# Patient Record
Sex: Female | Born: 1980 | Race: Black or African American | Hispanic: No | Marital: Married | Smoking: Never smoker
Health system: Southern US, Community
[De-identification: ages and names within clinical notes are randomized; demographics above are authoritative.]

## PROBLEM LIST (undated history)

## (undated) DIAGNOSIS — I5181 Takotsubo syndrome: Secondary | ICD-10-CM

## (undated) DIAGNOSIS — G43909 Migraine, unspecified, not intractable, without status migrainosus: Secondary | ICD-10-CM

## (undated) DIAGNOSIS — F32A Depression, unspecified: Secondary | ICD-10-CM

## (undated) DIAGNOSIS — I10 Essential (primary) hypertension: Secondary | ICD-10-CM

## (undated) DIAGNOSIS — L509 Urticaria, unspecified: Secondary | ICD-10-CM

## (undated) DIAGNOSIS — J45909 Unspecified asthma, uncomplicated: Secondary | ICD-10-CM

## (undated) DIAGNOSIS — R519 Headache, unspecified: Secondary | ICD-10-CM

## (undated) DIAGNOSIS — F419 Anxiety disorder, unspecified: Secondary | ICD-10-CM

## (undated) DIAGNOSIS — L309 Dermatitis, unspecified: Secondary | ICD-10-CM

## (undated) DIAGNOSIS — R0600 Dyspnea, unspecified: Secondary | ICD-10-CM

## (undated) DIAGNOSIS — M1990 Unspecified osteoarthritis, unspecified site: Secondary | ICD-10-CM

## (undated) HISTORY — DX: Dermatitis, unspecified: L30.9

## (undated) HISTORY — PX: SINUS EXPLORATION: SHX5214

## (undated) HISTORY — PX: DIAGNOSTIC LAPAROSCOPY WITH REMOVAL OF ECTOPIC PREGNANCY: SHX6449

## (undated) HISTORY — DX: Urticaria, unspecified: L50.9

## (undated) HISTORY — PX: SINOSCOPY: SHX187

## (undated) HISTORY — PX: INDUCED ABORTION: SHX677

---

## 2001-06-07 ENCOUNTER — Emergency Department (HOSPITAL_COMMUNITY): Admission: EM | Admit: 2001-06-07 | Discharge: 2001-06-07 | Payer: Self-pay | Admitting: Emergency Medicine

## 2001-06-10 ENCOUNTER — Emergency Department (HOSPITAL_COMMUNITY): Admission: EM | Admit: 2001-06-10 | Discharge: 2001-06-11 | Payer: Self-pay | Admitting: Emergency Medicine

## 2002-05-21 ENCOUNTER — Encounter: Payer: Self-pay | Admitting: Otolaryngology

## 2002-05-21 ENCOUNTER — Encounter: Admission: RE | Admit: 2002-05-21 | Discharge: 2002-05-21 | Payer: Self-pay | Admitting: Otolaryngology

## 2002-05-26 ENCOUNTER — Encounter: Admission: RE | Admit: 2002-05-26 | Discharge: 2002-05-26 | Payer: Self-pay | Admitting: Otolaryngology

## 2002-05-26 ENCOUNTER — Encounter: Payer: Self-pay | Admitting: Otolaryngology

## 2002-06-25 ENCOUNTER — Ambulatory Visit (HOSPITAL_BASED_OUTPATIENT_CLINIC_OR_DEPARTMENT_OTHER): Admission: RE | Admit: 2002-06-25 | Discharge: 2002-06-25 | Payer: Self-pay | Admitting: Otolaryngology

## 2019-11-04 ENCOUNTER — Ambulatory Visit: Payer: Self-pay | Admitting: Allergy & Immunology

## 2020-08-05 ENCOUNTER — Inpatient Hospital Stay (HOSPITAL_BASED_OUTPATIENT_CLINIC_OR_DEPARTMENT_OTHER): Payer: BC Managed Care – PPO

## 2020-08-05 ENCOUNTER — Inpatient Hospital Stay (HOSPITAL_COMMUNITY)
Admission: AD | Admit: 2020-08-05 | Discharge: 2020-08-05 | Disposition: A | Discharge status: Home / Self Care | Payer: BC Managed Care – PPO | Attending: Obstetrics and Gynecology | Admitting: Obstetrics and Gynecology

## 2020-08-05 ENCOUNTER — Encounter (HOSPITAL_COMMUNITY): Payer: Self-pay | Admitting: Obstetrics and Gynecology

## 2020-08-05 ENCOUNTER — Other Ambulatory Visit: Payer: Self-pay

## 2020-08-05 ENCOUNTER — Other Ambulatory Visit: Payer: Self-pay | Admitting: Obstetrics and Gynecology

## 2020-08-05 DIAGNOSIS — O468X1 Other antepartum hemorrhage, first trimester: Secondary | ICD-10-CM

## 2020-08-05 DIAGNOSIS — Z3A08 8 weeks gestation of pregnancy: Secondary | ICD-10-CM | POA: Diagnosis not present

## 2020-08-05 DIAGNOSIS — Z8759 Personal history of other complications of pregnancy, childbirth and the puerperium: Secondary | ICD-10-CM | POA: Diagnosis not present

## 2020-08-05 DIAGNOSIS — O209 Hemorrhage in early pregnancy, unspecified: Secondary | ICD-10-CM | POA: Diagnosis not present

## 2020-08-05 DIAGNOSIS — O09521 Supervision of elderly multigravida, first trimester: Secondary | ICD-10-CM | POA: Diagnosis not present

## 2020-08-05 DIAGNOSIS — O4690 Antepartum hemorrhage, unspecified, unspecified trimester: Secondary | ICD-10-CM

## 2020-08-05 DIAGNOSIS — O10911 Unspecified pre-existing hypertension complicating pregnancy, first trimester: Secondary | ICD-10-CM | POA: Insufficient documentation

## 2020-08-05 DIAGNOSIS — O4691 Antepartum hemorrhage, unspecified, first trimester: Secondary | ICD-10-CM | POA: Diagnosis not present

## 2020-08-05 DIAGNOSIS — O09291 Supervision of pregnancy with other poor reproductive or obstetric history, first trimester: Secondary | ICD-10-CM | POA: Insufficient documentation

## 2020-08-05 DIAGNOSIS — O0911 Supervision of pregnancy with history of ectopic or molar pregnancy, first trimester: Secondary | ICD-10-CM

## 2020-08-05 DIAGNOSIS — O418X10 Other specified disorders of amniotic fluid and membranes, first trimester, not applicable or unspecified: Secondary | ICD-10-CM

## 2020-08-05 DIAGNOSIS — O208 Other hemorrhage in early pregnancy: Secondary | ICD-10-CM | POA: Diagnosis present

## 2020-08-05 DIAGNOSIS — Z3A01 Less than 8 weeks gestation of pregnancy: Secondary | ICD-10-CM | POA: Diagnosis not present

## 2020-08-05 HISTORY — DX: Headache, unspecified: R51.9

## 2020-08-05 HISTORY — DX: Essential (primary) hypertension: I10

## 2020-08-05 HISTORY — DX: Unspecified asthma, uncomplicated: J45.909

## 2020-08-05 LAB — URINALYSIS, ROUTINE W REFLEX MICROSCOPIC
Bilirubin Urine: NEGATIVE
Glucose, UA: NEGATIVE mg/dL
Ketones, ur: 5 mg/dL — AB
Nitrite: NEGATIVE
Protein, ur: NEGATIVE mg/dL
Specific Gravity, Urine: 1.014 (ref 1.005–1.030)
pH: 7 (ref 5.0–8.0)

## 2020-08-05 NOTE — MAU Provider Note (Signed)
History     CSN: [id number]  Arrival date and time: 08/05/20 1314   First Provider Initiated Contact with Patient 08/05/20 1504      Chief Complaint  Patient presents with  . Vaginal Bleeding  . Possible Pregnancy   HPI  BrookeBrooke Walters is a 39 y.o. female [phone] @ [redacted]w[redacted]d by very certain LMP, here with vaginal bleeding. Sent here by Dr. Cherly Hensen.  She reports the bleeding started back on 8/12 and she was not able to present to the hospital at that time d/t a sick child.  The bleeding slowed down and she had no pain and she decided not to get checked out. She does have history of ectopic.  On 8/25: she spoke to Dr. Cherly Hensen over the phone. She had been trying to establish care with Dr. Cherly Hensen and was able to talk to her yesterday. Dr. Cherly Hensen recommended she has blood work done. She presented to Dr. Cherly Hensen office today for lab work which showed she was pregnant and reports her Brooke Walters was 4,000. Dr. Cherly Hensen called her and told her she was pregnant and informed her she needed to go to MAU for [place] d/t bleeding and her history of ectopic. She reports a certain LMP of 6/29. She had a + home pregnancy test on 7/29.  She describes the bleeding as very light at this time.   She was told that she was out of network by Dr. Cherly Hensen office and they were not able to take her insurance at this time. The patient is agreeable to follow up with our office.   Patient reports A positive blood type.   OB History    Gravida  9   Para  2   Term  2   Preterm      AB  6   Living        SAB      TAB      Ectopic  1   Multiple      Live Births              Past Medical History:  Diagnosis Date  . Asthma   . Headache   . Hypertension       No family history on file.  Social History   Tobacco Use  . Smoking status: Not on file  Substance Use Topics  . Alcohol use: Not on file  . Drug use: Not on file    Allergies: No Known Allergies  No medications prior to  admission.   Results for orders placed or performed during the hospital encounter of 08/05/20 (from the past 48 hour(s))  Urinalysis, Routine w reflex microscopic Urine, Clean Catch     Status: Abnormal   Collection Time: 08/05/20  2:55 PM  Result Value Ref Range   Color, Urine YELLOW YELLOW   APPearance CLOUDY (A) CLEAR   Specific Gravity, Urine 1.014 1.005 - 1.030   pH 7.0 5.0 - 8.0   Glucose, UA NEGATIVE NEGATIVE mg/dL   Hgb urine dipstick SMALL (A) NEGATIVE   Bilirubin Urine NEGATIVE NEGATIVE   Ketones, ur 5 (A) NEGATIVE mg/dL   Protein, ur NEGATIVE NEGATIVE mg/dL   Nitrite NEGATIVE NEGATIVE   Leukocytes,Ua MODERATE (A) NEGATIVE   RBC / HPF 0-5 0 - 5 RBC/hpf   WBC, UA 6-10 0 - 5 WBC/hpf   Bacteria, UA FEW (A) NONE SEEN   Squamous Epithelial / LPF 11-20 0 - 5   Mucus PRESENT  Comment: Performed at [facility], [address] [place], [place] [address]    US OB LESS THAN 14 WEEKS WITH OB TRANSVAGINAL  Result Date: 08/05/2020 CLINICAL DATA:  Bleeding in first trimester of pregnancy, history of ectopic pregnancy; LMP 05/25/2020; no quantitative beta HCG for correlation EXAM: OBSTETRIC <14 WK [place] AND TRANSVAGINAL OB US TECHNIQUE: Both transabdominal and transvaginal ultrasound examinations were performed for complete evaluation of the gestation as well as the maternal uterus, adnexal regions, and pelvic cul-de-sac. Transvaginal technique was performed to assess early pregnancy. COMPARISON:  None FINDINGS: Intrauterine gestational sac: Present, single Yolk sac:  Present Embryo:  Present Cardiac Activity: Present Heart Rate: 104 bpm CRL:  4.2 mm   6 w   1 d                  [place] EDC: 03/30/2021 Subchorionic hemorrhage:  Small subchronic hemorrhage Maternal uterus/adnexae: Maternal uterus anteverted, otherwise unremarkable. RIGHT ovary measures 2.4 x 4.1 x 3.0 cm and contains a small corpus luteum. LEFT ovary normal size and morphology, 3.0 x 2.6 x 2.7 cm. No free pelvic fluid or  adnexal masses. IMPRESSION: Single live intrauterine gestation at 6 weeks 1 day EGA by crown-rump length. Small subchronic hemorrhage Electronically Signed   By: Ulyses Southward M.D.   On: 08/05/2020 16:06    Review of Systems  Gastrointestinal: Negative for abdominal pain.  Genitourinary: Positive for vaginal bleeding.   Physical Exam   Blood pressure 132/78, pulse 92, temperature 98.7 F (37.1 C), resp. rate 16, height 5\' 7"  (1.702 m), weight 75.3 kg, last menstrual period 05/25/2020, SpO2 100 %.  Physical Exam Constitutional:      General: She is not in acute distress.    Appearance: Normal appearance. She is not ill-appearing or toxic-appearing.  Eyes:     Pupils: Pupils are equal, round, and reactive to light.  Musculoskeletal:        General: Normal range of motion.  Skin:    General: Skin is warm.  Neurological:     Mental Status: She is alert and oriented to person, place, and time.  Psychiatric:        Mood and Affect: Mood normal.    MAU Course  Procedures  None  MDM  Dr. 05/27/2020 requested MAU provider to see patient.  Hcg: 4068 today Cherly Hensen ordered and complete Patient came to the nurses station fully dressed after [place] asking how much longer the wait would be; I did not evaluate her bleeding d/t patient dressed and ready to leave Discussed [place] in detail with patient   Assessment and Plan   A:  1. Subchorionic hemorrhage of placenta in first trimester, single or unspecified fetus   2. Vaginal bleeding during pregnancy   3. [redacted] weeks gestation of pregnancy     P:  Discharge home in stable condition SIUP Repeat US in 10-14 days for viability. LMP and CRL not consistent.  Return to MAU if symptoms worsen Bleeding reported as very light  Brooke Walters, 09-05-1978, NP 08/05/2020 7:34 PM

## 2020-08-05 NOTE — MAU Note (Signed)
.   Brooke Walters is a 39 y.o. at [redacted]w[redacted]d here in MAU reporting: she had an ectopic pregnancy in 2017 and she is here today to rule another ectopic out. Pt had her last cycle the middle of June. She was being evaluated in [facility] and state that she started having heavy vaginal bleeding with clots in July and thought she miscarriaged but went to follow up with cousins today and had a QUANT drawn and it showed a QUANT of 4068. PT states it could not be a new pregnancy. Here for follow up LMP: 05/25/20 Onset of complaint: ongoing Pain score: 0 Vitals:   08/05/20 1349  BP: 132/78  Pulse: 92  Resp: 16  Temp: 98.7 F (37.1 C)  SpO2: 100%     FHT: Lab orders placed from triage UA

## 2020-08-05 NOTE — Discharge Instructions (Signed)

## 2020-08-09 ENCOUNTER — Other Ambulatory Visit: Payer: Self-pay | Admitting: General Practice

## 2020-08-09 DIAGNOSIS — O3680X0 Pregnancy with inconclusive fetal viability, not applicable or unspecified: Secondary | ICD-10-CM

## 2020-08-18 ENCOUNTER — Other Ambulatory Visit: Payer: Self-pay

## 2020-08-18 ENCOUNTER — Telehealth: Payer: Self-pay | Admitting: Obstetrics and Gynecology

## 2020-08-18 ENCOUNTER — Ambulatory Visit
Admission: RE | Admit: 2020-08-18 | Discharge: 2020-08-18 | Disposition: A | Discharge status: Home / Self Care | Payer: BC Managed Care – PPO | Source: Ambulatory Visit | Attending: Obstetrics and Gynecology | Admitting: Obstetrics and Gynecology

## 2020-08-18 DIAGNOSIS — O3680X0 Pregnancy with inconclusive fetal viability, not applicable or unspecified: Secondary | ICD-10-CM | POA: Insufficient documentation

## 2020-08-18 MED ORDER — DOXYCYCLINE HYCLATE 50 MG PO CAPS: 200.0000 mg | ORAL_CAPSULE | Freq: Once | ORAL | 0 refills | Status: AC

## 2020-08-18 MED ORDER — LORAZEPAM 2 MG PO TABS: 2.0000 mg | ORAL_TABLET | Freq: Three times a day (TID) | ORAL | 0 refills | Status: DC

## 2020-08-18 MED ORDER — OXYCODONE-ACETAMINOPHEN 10-325 MG PO TABS: 1.0000 | ORAL_TABLET | Freq: Once | ORAL | 0 refills | Status: AC

## 2020-08-18 NOTE — Telephone Encounter (Signed)
F/u US shows 6 week fetus now without a fetal heart rate. The patient was called at home this evening and the results were discussed with her.  We discussed options for evacuation of fetus including watchful waiting, Cytotec, D&E and MVA. Patient opted to have MVA in the office.   Rx: Percocet, Ativan, Doxycyline sent.  High priority message sent to [facility] to schedule. The patient was given the office phone number.    Duane Lope, NP 08/18/2020 6:39 PM

## 2020-08-19 ENCOUNTER — Ambulatory Visit: Payer: BC Managed Care – PPO

## 2020-08-20 ENCOUNTER — Telehealth: Payer: Self-pay

## 2020-08-20 ENCOUNTER — Other Ambulatory Visit (HOSPITAL_COMMUNITY): Admission: RE | Admit: 2020-08-20 | Payer: BC Managed Care – PPO | Source: Ambulatory Visit

## 2020-08-20 NOTE — Telephone Encounter (Signed)
Called and spoke to the patient, surgery date, time and pre-op instructions given. Patient expressed understanding. All questions answered. Advised her to give [place] a call back if she needed anything before her surgery.

## 2020-08-20 NOTE — Telephone Encounter (Signed)
-----   Message from Blenda Mounts sent at 08/19/2020  3:04 PM EDT ----- Regarding: D & C for patient   Good Afternoon Rhea Bleacher,  This patient needs to schedule for a D& C with OR.  Venia Carbon sent a message that the patient needs an MVA in the office.  We do not have any available appointments.  I also reached out to the [facility] office and they didn't have any available appointments as well.    Thanks Verdie Mosher

## 2020-08-20 NOTE — Telephone Encounter (Signed)
Patient called and left a message about waiting to add on a tubal the day of her D&E.   Called patient back and let her know that I would pass her message along to the MD performing her surgery and that I would also send a message to our person who does authorizations so if Dr. Alysia Penna does agree we would already have authorization. Patient expressed understanding  Message sent to appropriate staff members

## 2020-08-23 ENCOUNTER — Encounter (HOSPITAL_BASED_OUTPATIENT_CLINIC_OR_DEPARTMENT_OTHER)
Admission: RE | Admit: 2020-08-23 | Discharge: 2020-08-23 | Disposition: A | Discharge status: Home / Self Care | Payer: BC Managed Care – PPO | Source: Ambulatory Visit | Attending: Obstetrics and Gynecology | Admitting: Obstetrics and Gynecology

## 2020-08-23 ENCOUNTER — Encounter (HOSPITAL_BASED_OUTPATIENT_CLINIC_OR_DEPARTMENT_OTHER): Payer: Self-pay | Admitting: Obstetrics and Gynecology

## 2020-08-23 ENCOUNTER — Other Ambulatory Visit: Payer: Self-pay

## 2020-08-23 ENCOUNTER — Other Ambulatory Visit (HOSPITAL_COMMUNITY)
Admission: RE | Admit: 2020-08-23 | Discharge: 2020-08-23 | Disposition: A | Discharge status: Home / Self Care | Payer: BC Managed Care – PPO | Source: Ambulatory Visit | Attending: Obstetrics and Gynecology | Admitting: Obstetrics and Gynecology

## 2020-08-23 DIAGNOSIS — Z9102 Food additives allergy status: Secondary | ICD-10-CM | POA: Diagnosis not present

## 2020-08-23 DIAGNOSIS — Z302 Encounter for sterilization: Secondary | ICD-10-CM | POA: Diagnosis present

## 2020-08-23 DIAGNOSIS — I10 Essential (primary) hypertension: Secondary | ICD-10-CM | POA: Diagnosis not present

## 2020-08-23 DIAGNOSIS — M1990 Unspecified osteoarthritis, unspecified site: Secondary | ICD-10-CM | POA: Diagnosis not present

## 2020-08-23 DIAGNOSIS — O99411 Diseases of the circulatory system complicating pregnancy, first trimester: Secondary | ICD-10-CM | POA: Diagnosis not present

## 2020-08-23 DIAGNOSIS — Z01812 Encounter for preprocedural laboratory examination: Secondary | ICD-10-CM | POA: Insufficient documentation

## 2020-08-23 DIAGNOSIS — Z886 Allergy status to analgesic agent status: Secondary | ICD-10-CM | POA: Diagnosis not present

## 2020-08-23 DIAGNOSIS — J45909 Unspecified asthma, uncomplicated: Secondary | ICD-10-CM | POA: Diagnosis not present

## 2020-08-23 DIAGNOSIS — Z20822 Contact with and (suspected) exposure to covid-19: Secondary | ICD-10-CM | POA: Diagnosis not present

## 2020-08-23 DIAGNOSIS — O99511 Diseases of the respiratory system complicating pregnancy, first trimester: Secondary | ICD-10-CM | POA: Diagnosis not present

## 2020-08-23 DIAGNOSIS — O99891 Other specified diseases and conditions complicating pregnancy: Secondary | ICD-10-CM | POA: Diagnosis not present

## 2020-08-23 DIAGNOSIS — O021 Missed abortion: Secondary | ICD-10-CM | POA: Diagnosis not present

## 2020-08-23 DIAGNOSIS — Z888 Allergy status to other drugs, medicaments and biological substances status: Secondary | ICD-10-CM | POA: Diagnosis not present

## 2020-08-23 DIAGNOSIS — Z8759 Personal history of other complications of pregnancy, childbirth and the puerperium: Secondary | ICD-10-CM | POA: Diagnosis not present

## 2020-08-23 DIAGNOSIS — Z880 Allergy status to penicillin: Secondary | ICD-10-CM | POA: Diagnosis not present

## 2020-08-23 LAB — CBC
HCT: 36.5 % (ref 36.0–46.0)
Hemoglobin: 11.5 g/dL — ABNORMAL LOW (ref 12.0–15.0)
MCH: 32.2 pg (ref 26.0–34.0)
MCHC: 31.5 g/dL (ref 30.0–36.0)
MCV: 102.2 fL — ABNORMAL HIGH (ref 80.0–100.0)
Platelets: 199 10*3/uL (ref 150–400)
RBC: 3.57 MIL/uL — ABNORMAL LOW (ref 3.87–5.11)
RDW: 13.3 % (ref 11.5–15.5)
WBC: 4.3 10*3/uL (ref 4.0–10.5)
nRBC: 0 % (ref 0.0–0.2)

## 2020-08-23 LAB — COMPREHENSIVE METABOLIC PANEL
ALT: 19 U/L (ref 0–44)
AST: 39 U/L (ref 15–41)
Albumin: 4 g/dL (ref 3.5–5.0)
Alkaline Phosphatase: 42 U/L (ref 38–126)
Anion gap: 8 (ref 5–15)
BUN: 13 mg/dL (ref 6–20)
CO2: 25 mmol/L (ref 22–32)
Calcium: 9.1 mg/dL (ref 8.9–10.3)
Chloride: 106 mmol/L (ref 98–111)
Creatinine, Ser: 0.95 mg/dL (ref 0.44–1.00)
GFR calc Af Amer: >60 mL/min (ref >60)
GFR calc non Af Amer: >60 mL/min (ref >60)
Glucose, Bld: 90 mg/dL (ref 70–99)
Potassium: 4.2 mmol/L (ref 3.5–5.1)
Sodium: 139 mmol/L (ref 135–145)
Total Bilirubin: 0.7 mg/dL (ref 0.3–1.2)
Total Protein: 6.9 g/dL (ref 6.5–8.1)

## 2020-08-23 LAB — TYPE AND SCREEN
ABO/RH(D): A POS
Antibody Screen: NEGATIVE

## 2020-08-23 LAB — SARS CORONAVIRUS 2 (TAT 6-24 HRS): SARS Coronavirus 2: NEGATIVE

## 2020-08-23 NOTE — H&P (Addendum)
Brooke Walters is an 39 y.o. female [phone] with Dx of MAB by 6 week U/S. Desires definite therapy. H/O ectopic pregnancy in the past. She also desires tubal ligation   Menstrual History: Menarche age: 37 Patient's last menstrual period was 06/08/2020.    Past Medical History:  Diagnosis Date  . Anxiety   . Asthma   . Broken heart syndrome   . Headache   . Hypertension   . Migraine   . Osteoarthritis     Past Surgical History:  Procedure Laterality Date  . DIAGNOSTIC LAPAROSCOPY WITH REMOVAL OF ECTOPIC PREGNANCY    . INDUCED ABORTION    . SINUS EXPLORATION     x2    History reviewed. No pertinent family history.  Social History:  reports that she has never smoked. She has never used smokeless tobacco. She reports previous alcohol use. She reports that she does not use drugs.  Allergies:  Allergies  Allergen Reactions  . Ibuprofen Shortness Of Breath  . Penicillins Hives  . Yellow Dyes (Non-Tartrazine) Hives    Pt received dye durning surgery. Not sure what type of dye  . Zyrtec [Cetirizine] Hives    No medications prior to admission.    Review of Systems  Constitutional: Negative.   Respiratory: Negative.   Cardiovascular: Negative.   Gastrointestinal: Negative.     Height 5' 7.5" (1.715 m), weight 74.4 kg, last menstrual period 06/08/2020. Physical Exam Constitutional:      Appearance: Normal appearance.  Cardiovascular:     Rate and Rhythm: Normal rate and regular rhythm.     Heart sounds: Normal heart sounds.  Pulmonary:     Effort: Pulmonary effort is normal.     Breath sounds: Normal breath sounds.  Abdominal:     General: Bowel sounds are normal.     Palpations: Abdomen is soft.  Genitourinary:    Comments: Deferred to OR Neurological:     Mental Status: She is alert.     No results found for this or any previous visit (from the past 24 hour(s)).  No results found.  Assessment/Plan: Missed AB Unwanted fertility  Pt for  suction D & C. Methods of tubal ligation reviewed with pt. Following discussion pt desires salpingectomy.  R/B/Post op care of procedure reviewed with pt. Pt has verbalized understanding and desires to proceed.   Hermina Staggers 08/23/2020, 10:02 AM

## 2020-08-24 ENCOUNTER — Other Ambulatory Visit: Payer: Self-pay

## 2020-08-24 ENCOUNTER — Encounter (HOSPITAL_BASED_OUTPATIENT_CLINIC_OR_DEPARTMENT_OTHER): Payer: Self-pay | Admitting: Obstetrics and Gynecology

## 2020-08-24 ENCOUNTER — Ambulatory Visit (HOSPITAL_BASED_OUTPATIENT_CLINIC_OR_DEPARTMENT_OTHER)
Admission: RE | Admit: 2020-08-24 | Discharge: 2020-08-24 | Disposition: A | Discharge status: Home / Self Care | Payer: BC Managed Care – PPO | Attending: Obstetrics and Gynecology | Admitting: Obstetrics and Gynecology

## 2020-08-24 ENCOUNTER — Encounter (HOSPITAL_BASED_OUTPATIENT_CLINIC_OR_DEPARTMENT_OTHER)
Admission: RE | Disposition: A | Discharge status: Home / Self Care | Payer: Self-pay | Source: Home / Self Care | Attending: Obstetrics and Gynecology

## 2020-08-24 ENCOUNTER — Ambulatory Visit (HOSPITAL_BASED_OUTPATIENT_CLINIC_OR_DEPARTMENT_OTHER): Payer: BC Managed Care – PPO | Admitting: Certified Registered"

## 2020-08-24 DIAGNOSIS — M1990 Unspecified osteoarthritis, unspecified site: Secondary | ICD-10-CM | POA: Insufficient documentation

## 2020-08-24 DIAGNOSIS — Z880 Allergy status to penicillin: Secondary | ICD-10-CM | POA: Insufficient documentation

## 2020-08-24 DIAGNOSIS — Z886 Allergy status to analgesic agent status: Secondary | ICD-10-CM | POA: Insufficient documentation

## 2020-08-24 DIAGNOSIS — Z302 Encounter for sterilization: Secondary | ICD-10-CM | POA: Diagnosis not present

## 2020-08-24 DIAGNOSIS — Z9102 Food additives allergy status: Secondary | ICD-10-CM | POA: Insufficient documentation

## 2020-08-24 DIAGNOSIS — O021 Missed abortion: Secondary | ICD-10-CM | POA: Diagnosis not present

## 2020-08-24 DIAGNOSIS — O99891 Other specified diseases and conditions complicating pregnancy: Secondary | ICD-10-CM | POA: Insufficient documentation

## 2020-08-24 DIAGNOSIS — O99411 Diseases of the circulatory system complicating pregnancy, first trimester: Secondary | ICD-10-CM | POA: Insufficient documentation

## 2020-08-24 DIAGNOSIS — J45909 Unspecified asthma, uncomplicated: Secondary | ICD-10-CM | POA: Insufficient documentation

## 2020-08-24 DIAGNOSIS — I10 Essential (primary) hypertension: Secondary | ICD-10-CM | POA: Insufficient documentation

## 2020-08-24 DIAGNOSIS — Z8759 Personal history of other complications of pregnancy, childbirth and the puerperium: Secondary | ICD-10-CM | POA: Insufficient documentation

## 2020-08-24 DIAGNOSIS — O99511 Diseases of the respiratory system complicating pregnancy, first trimester: Secondary | ICD-10-CM | POA: Insufficient documentation

## 2020-08-24 DIAGNOSIS — Z888 Allergy status to other drugs, medicaments and biological substances status: Secondary | ICD-10-CM | POA: Insufficient documentation

## 2020-08-24 DIAGNOSIS — Z3009 Encounter for other general counseling and advice on contraception: Secondary | ICD-10-CM

## 2020-08-24 HISTORY — DX: Takotsubo syndrome: I51.81

## 2020-08-24 HISTORY — PX: LAPAROSCOPIC TUBAL LIGATION: SHX1937

## 2020-08-24 HISTORY — DX: Migraine, unspecified, not intractable, without status migrainosus: G43.909

## 2020-08-24 HISTORY — DX: Anxiety disorder, unspecified: F41.9

## 2020-08-24 HISTORY — DX: Unspecified osteoarthritis, unspecified site: M19.90

## 2020-08-24 HISTORY — PX: DILATION AND EVACUATION: SHX1459

## 2020-08-24 LAB — ABO/RH: ABO/RH(D): A POS

## 2020-08-24 SURGERY — DILATION AND EVACUATION, UTERUS
Anesthesia: General | Site: Vagina

## 2020-08-24 MED ORDER — FENTANYL CITRATE (PF) 100 MCG/2ML IJ SOLN
INTRAMUSCULAR | Status: AC
  Filled 2020-08-24: qty 2

## 2020-08-24 MED ORDER — POVIDONE-IODINE 10 % EX SWAB: 2.0000 "application " | Freq: Once | CUTANEOUS | Status: DC

## 2020-08-24 MED ORDER — PROMETHAZINE HCL 25 MG/ML IJ SOLN: 6.2500 mg | INTRAMUSCULAR | Status: DC | PRN

## 2020-08-24 MED ORDER — SUGAMMADEX SODIUM 200 MG/2ML IV SOLN
INTRAVENOUS | Status: DC | PRN
  Administered 2020-08-24: 200 mg via INTRAVENOUS

## 2020-08-24 MED ORDER — SILVER NITRATE-POT NITRATE 75-25 % EX MISC
CUTANEOUS | Status: AC
  Filled 2020-08-24: qty 10

## 2020-08-24 MED ORDER — AMISULPRIDE (ANTIEMETIC) 5 MG/2ML IV SOLN: 10.0000 mg | Freq: Once | INTRAVENOUS | Status: DC | PRN

## 2020-08-24 MED ORDER — LACTATED RINGERS IV SOLN: INTRAVENOUS | Status: DC

## 2020-08-24 MED ORDER — OXYCODONE HCL 5 MG PO TABS
5.0000 mg | ORAL_TABLET | Freq: Once | ORAL | Status: AC | PRN
  Administered 2020-08-24: 5 mg via ORAL

## 2020-08-24 MED ORDER — ROCURONIUM BROMIDE 100 MG/10ML IV SOLN
INTRAVENOUS | Status: DC | PRN
  Administered 2020-08-24: 70 mg via INTRAVENOUS

## 2020-08-24 MED ORDER — ACETAMINOPHEN 500 MG PO TABS
ORAL_TABLET | ORAL | Status: AC
  Filled 2020-08-24: qty 2

## 2020-08-24 MED ORDER — ACETAMINOPHEN 500 MG PO TABS
1000.0000 mg | ORAL_TABLET | ORAL | Status: AC
  Administered 2020-08-24: 1000 mg via ORAL

## 2020-08-24 MED ORDER — HYDROMORPHONE HCL 1 MG/ML IJ SOLN
0.2500 mg | INTRAMUSCULAR | Status: DC | PRN
  Administered 2020-08-24 (×3): 0.5 mg via INTRAVENOUS

## 2020-08-24 MED ORDER — FENTANYL CITRATE (PF) 100 MCG/2ML IJ SOLN
INTRAMUSCULAR | Status: DC | PRN
Start: 2020-08-24 — End: 2020-08-24
  Administered 2020-08-24 (×3): 50 ug via INTRAVENOUS

## 2020-08-24 MED ORDER — OXYCODONE HCL 5 MG/5ML PO SOLN: 5.0000 mg | Freq: Once | ORAL | Status: AC | PRN

## 2020-08-24 MED ORDER — MEPERIDINE HCL 25 MG/ML IJ SOLN: 6.2500 mg | INTRAMUSCULAR | Status: DC | PRN

## 2020-08-24 MED ORDER — ESMOLOL HCL 100 MG/10ML IV SOLN
INTRAVENOUS | Status: AC
  Filled 2020-08-24: qty 10

## 2020-08-24 MED ORDER — HYDROMORPHONE HCL 1 MG/ML IJ SOLN
INTRAMUSCULAR | Status: AC
  Filled 2020-08-24: qty 0.5

## 2020-08-24 MED ORDER — ONDANSETRON HCL 4 MG/2ML IJ SOLN
INTRAMUSCULAR | Status: DC | PRN
  Administered 2020-08-24 (×2): 4 mg via INTRAVENOUS

## 2020-08-24 MED ORDER — LIDOCAINE HCL (PF) 1 % IJ SOLN
INTRAMUSCULAR | Status: AC
  Filled 2020-08-24: qty 30

## 2020-08-24 MED ORDER — OXYCODONE HCL 5 MG PO TABS: 5.0000 mg | ORAL_TABLET | Freq: Four times a day (QID) | ORAL | 0 refills | Status: DC | PRN

## 2020-08-24 MED ORDER — LIDOCAINE 2% (20 MG/ML) 5 ML SYRINGE
INTRAMUSCULAR | Status: AC
  Filled 2020-08-24: qty 5

## 2020-08-24 MED ORDER — ONDANSETRON HCL 4 MG/2ML IJ SOLN
INTRAMUSCULAR | Status: AC
  Filled 2020-08-24: qty 4

## 2020-08-24 MED ORDER — DEXAMETHASONE SODIUM PHOSPHATE 10 MG/ML IJ SOLN
INTRAMUSCULAR | Status: AC
  Filled 2020-08-24: qty 1

## 2020-08-24 MED ORDER — DEXAMETHASONE SODIUM PHOSPHATE 10 MG/ML IJ SOLN
INTRAMUSCULAR | Status: DC | PRN
  Administered 2020-08-24: 10 mg via INTRAVENOUS

## 2020-08-24 MED ORDER — BUPIVACAINE HCL 0.25 % IJ SOLN
INTRAMUSCULAR | Status: DC | PRN
  Administered 2020-08-24: 30 mL

## 2020-08-24 MED ORDER — KETOROLAC TROMETHAMINE 30 MG/ML IJ SOLN
INTRAMUSCULAR | Status: AC
  Filled 2020-08-24: qty 1

## 2020-08-24 MED ORDER — MIDAZOLAM HCL 2 MG/2ML IJ SOLN
INTRAMUSCULAR | Status: AC
  Filled 2020-08-24: qty 2

## 2020-08-24 MED ORDER — PROPOFOL 10 MG/ML IV BOLUS
INTRAVENOUS | Status: DC | PRN
  Administered 2020-08-24: 150 mg via INTRAVENOUS

## 2020-08-24 MED ORDER — MIDAZOLAM HCL 5 MG/5ML IJ SOLN
INTRAMUSCULAR | Status: DC | PRN
  Administered 2020-08-24: 2 mg via INTRAVENOUS

## 2020-08-24 MED ORDER — MISOPROSTOL 200 MCG PO TABS
ORAL_TABLET | ORAL | Status: AC
  Filled 2020-08-24: qty 1

## 2020-08-24 MED ORDER — SOD CITRATE-CITRIC ACID 500-334 MG/5ML PO SOLN
30.0000 mL | ORAL | Status: AC
  Administered 2020-08-24: 30 mL via ORAL
  Filled 2020-08-24 (×2): qty 30

## 2020-08-24 MED ORDER — SUGAMMADEX SODIUM 500 MG/5ML IV SOLN
INTRAVENOUS | Status: AC
  Filled 2020-08-24: qty 5

## 2020-08-24 MED ORDER — OXYCODONE HCL 5 MG PO TABS
ORAL_TABLET | ORAL | Status: AC
  Filled 2020-08-24: qty 1

## 2020-08-24 MED ORDER — METHYLERGONOVINE MALEATE 0.2 MG PO TABS
ORAL_TABLET | ORAL | Status: AC
  Filled 2020-08-24: qty 1

## 2020-08-24 MED ORDER — PROPOFOL 500 MG/50ML IV EMUL
INTRAVENOUS | Status: AC
  Filled 2020-08-24: qty 50

## 2020-08-24 MED ORDER — LACTATED RINGERS IV SOLN
INTRAVENOUS | Status: DC
  Administered 2020-08-24: 100 mL/h via INTRAVENOUS

## 2020-08-24 MED ORDER — BUPIVACAINE HCL (PF) 0.25 % IJ SOLN
INTRAMUSCULAR | Status: AC
  Filled 2020-08-24: qty 30

## 2020-08-24 MED ORDER — ONDANSETRON HCL 4 MG/2ML IJ SOLN
INTRAMUSCULAR | Status: AC
  Filled 2020-08-24: qty 2

## 2020-08-24 MED ORDER — LIDOCAINE 2% (20 MG/ML) 5 ML SYRINGE
INTRAMUSCULAR | Status: DC | PRN
  Administered 2020-08-24: 80 mg via INTRAVENOUS

## 2020-08-24 SURGICAL SUPPLY — 39 items
CATH ROBINSON RED A/P 16FR (CATHETERS) ×3 IMPLANT
DECANTER SPIKE VIAL GLASS SM (MISCELLANEOUS) ×3 IMPLANT
DRSG OPSITE POSTOP 3X4 (GAUZE/BANDAGES/DRESSINGS) ×1 IMPLANT
DURAPREP 26ML APPLICATOR (WOUND CARE) ×3 IMPLANT
GAUZE 4X4 16PLY RFD (DISPOSABLE) ×3 IMPLANT
GLOVE BIO SURGEON STRL SZ7.5 (GLOVE) ×6 IMPLANT
GLOVE BIOGEL PI IND STRL 7.0 (GLOVE) ×6 IMPLANT
GLOVE BIOGEL PI IND STRL 8 (GLOVE) ×2 IMPLANT
GLOVE BIOGEL PI INDICATOR 7.0 (GLOVE) ×3
GLOVE BIOGEL PI INDICATOR 8 (GLOVE) ×1
GOWN STRL REUS W/ TWL LRG LVL3 (GOWN DISPOSABLE) ×2 IMPLANT
GOWN STRL REUS W/ TWL XL LVL3 (GOWN DISPOSABLE) ×2 IMPLANT
GOWN STRL REUS W/TWL LRG LVL3 (GOWN DISPOSABLE) ×6 IMPLANT
GOWN STRL REUS W/TWL XL LVL3 (GOWN DISPOSABLE) ×6 IMPLANT
HIBICLENS CHG 4% 4OZ BTL (MISCELLANEOUS) ×3 IMPLANT
KIT BERKELEY 1ST TRI 3/8 NO TR (MISCELLANEOUS) ×3 IMPLANT
KIT BERKELEY 1ST TRIMESTER 3/8 (MISCELLANEOUS) ×3 IMPLANT
NS IRRIG 1000ML POUR BTL (IV SOLUTION) ×6 IMPLANT
PACK LAPAROSCOPY BASIN (CUSTOM PROCEDURE TRAY) ×3 IMPLANT
PACK TRENDGUARD 450 HYBRID PRO (MISCELLANEOUS) IMPLANT
PACK VAGINAL MINOR WOMEN LF (CUSTOM PROCEDURE TRAY) ×3 IMPLANT
PAD OB MATERNITY 4.3X12.25 (PERSONAL CARE ITEMS) ×6 IMPLANT
PAD PREP 24X48 CUFFED NSTRL (MISCELLANEOUS) ×6 IMPLANT
SET BERKELEY SUCTION TUBING (SUCTIONS) ×3 IMPLANT
SET TUBE SMOKE EVAC HIGH FLOW (TUBING) ×3 IMPLANT
SHEARS HARMONIC ACE PLUS 36CM (ENDOMECHANICALS) ×1 IMPLANT
SLEEVE SCD COMPRESS KNEE MED (MISCELLANEOUS) ×3 IMPLANT
SUT MNCRL AB 4-0 PS2 18 (SUTURE) ×3 IMPLANT
SUT VIC AB 2-0 CT1 27 (SUTURE) ×3
SUT VIC AB 2-0 CT1 TAPERPNT 27 (SUTURE) IMPLANT
SUT VICRYL 0 UR6 27IN ABS (SUTURE) ×6 IMPLANT
TOWEL GREEN STERILE FF (TOWEL DISPOSABLE) ×12 IMPLANT
TRENDGUARD 450 HYBRID PRO PACK (MISCELLANEOUS) ×3
TROCAR BALLN 12MMX100 BLUNT (TROCAR) ×3 IMPLANT
VACURETTE 10 RIGID CVD (CANNULA) IMPLANT
VACURETTE 7MM CVD STRL WRAP (CANNULA) ×1 IMPLANT
VACURETTE 8 RIGID CVD (CANNULA) IMPLANT
VACURETTE 9 RIGID CVD (CANNULA) IMPLANT
WARMER LAPAROSCOPE (MISCELLANEOUS) ×3 IMPLANT

## 2020-08-24 NOTE — Transfer of Care (Signed)
Immediate Anesthesia Transfer of Care Note  Patient: Brooke Walters  Procedure(s) Performed: DILATATION AND EVACUATION (N/A Vagina ) LAPAROSCOPIC LEFT SALPINGECTOMY (Left Abdomen)  Patient Location: PACU  Anesthesia Type:General  Level of Consciousness: drowsy and patient cooperative  Airway & Oxygen Therapy: Patient Spontanous Breathing and Patient connected to face mask oxygen  Post-op Assessment: Report given to RN and Post -op Vital signs reviewed and stable  Post vital signs: Reviewed and stable  Last Vitals:  Vitals Value Taken Time  BP 131/87 08/24/20 0907  Temp    Pulse 102 08/24/20 0911  Resp 18 08/24/20 0911  SpO2 100 % 08/24/20 0911  Vitals shown include unvalidated device data.  Last Pain:  Vitals:   08/24/20 0647  TempSrc: Oral  PainSc: 0-No pain      Patients Stated Pain Goal: 5 (08/24/20 2440)  Complications: No complications documented.

## 2020-08-24 NOTE — Discharge Instructions (Signed)
Laparoscopic Tubal Ligation, Care After This sheet gives you information about how to care for yourself after your procedure. Your health care provider may also give you more specific instructions. If you have problems or questions, contact your health care provider. What can I expect after the procedure? After the procedure, it is common to have:  A sore throat.  Discomfort in your shoulder.  Mild discomfort or cramping in your abdomen.  Gas pains.  Pain or soreness in the area where the surgical incision was made.  A bloated feeling.  Tiredness.  Nausea.  Vomiting. Follow these instructions at home: Medicines  Take over-the-counter and prescription medicines only as told by your health care provider.  Do not take aspirin because it can cause bleeding.  Ask your health care provider if the medicine prescribed to you: ? Requires you to avoid driving or using heavy machinery. ? Can cause constipation. You may need to take actions to prevent or treat constipation, such as:  Drink enough fluid to keep your urine pale yellow.  Take over-the-counter or prescription medicines.  Eat foods that are high in fiber, such as beans, whole grains, and fresh fruits and vegetables.  Limit foods that are high in fat and processed sugars, such as fried or sweet foods. Incision care      Follow instructions from your health care provider about how to take care of your incision. Make sure you: ? Wash your hands with soap and water before and after you change your bandage (dressing). If soap and water are not available, use hand sanitizer. ? Change your dressing as told by your health care provider. ? Leave stitches (sutures), skin glue, or adhesive strips in place. These skin closures may need to stay in place for 2 weeks or longer. If adhesive strip edges start to loosen and curl up, you may trim the loose edges. Do not remove adhesive strips completely unless your health care provider  tells you to do that.  Check your incision area every day for signs of infection. Check for: ? Redness, swelling, or pain. ? Fluid or blood. ? Warmth. ? Pus or a bad smell. Activity  Rest as told by your health care provider.  Avoid sitting for a long time without moving. Get up to take short walks every 1-2 hours. This is important to improve blood flow and breathing. Ask for help if you feel weak or unsteady.  Return to your normal activities as told by your health care provider. Ask your health care provider what activities are safe for you. General instructions  Do not take baths, swim, or use a hot tub until your health care provider approves. Ask your health care provider if you may take showers. You may only be allowed to take sponge baths.  Have someone help you with your daily household tasks for the first few days.  Keep all follow-up visits as told by your health care provider. This is important. Contact a health care provider if:  You have redness, swelling, or pain around your incision.  Your incision feels warm to the touch.  You have pus or a bad smell coming from your incision.  The edges of your incision break open after the sutures have been removed.  Your pain does not improve after 2-3 days.  You have a rash.  You repeatedly become dizzy or light-headed.  Your pain medicine is not helping. Get help right away if you:  Have a fever.  Faint.  Have increasing   pain in your abdomen.  Have severe pain in one or both of your shoulders.  Have fluid or blood coming from your sutures or from your vagina.  Have shortness of breath or difficulty breathing.  Have chest pain or leg pain.  Have ongoing nausea, vomiting, or diarrhea. Summary  After the procedure, it is common to have mild discomfort or cramping in your abdomen.  Take over-the-counter and prescription medicines only as told by your health care provider.  Watch for symptoms that should  prompt you to call your health care provider.  Keep all follow-up visits as told by your health care provider. This is important. This information is not intended to replace advice given to you by your health care provider. Make sure you discuss any questions you have with your health care provider. Document Revised: 05/06/2019 Document Reviewed: 10/22/2018 Elsevier Patient Education  2020 Elsevier Inc.     Post Anesthesia Home Care Instructions  Activity: Get plenty of rest for the remainder of the day. A responsible individual must stay with you for 24 hours following the procedure.  For the next 24 hours, DO NOT: -Drive a car -Operate machinery -Drink alcoholic beverages -Take any medication unless instructed by your physician -Make any legal decisions or sign important papers.  Meals: Start with liquid foods such as gelatin or soup. Progress to regular foods as tolerated. Avoid greasy, spicy, heavy foods. If nausea and/or vomiting occur, drink only clear liquids until the nausea and/or vomiting subsides. Call your physician if vomiting continues.  Special Instructions/Symptoms: Your throat may feel dry or sore from the anesthesia or the breathing tube placed in your throat during surgery. If this causes discomfort, gargle with warm salt water. The discomfort should disappear within 24 hours.  If you had a scopolamine patch placed behind your ear for the management of post- operative nausea and/or vomiting:  1. The medication in the patch is effective for 72 hours, after which it should be removed.  Wrap patch in a tissue and discard in the trash. Wash hands thoroughly with soap and water. 2. You may remove the patch earlier than 72 hours if you experience unpleasant side effects which may include dry mouth, dizziness or visual disturbances. 3. Avoid touching the patch. Wash your hands with soap and water after contact with the patch.     

## 2020-08-24 NOTE — Anesthesia Procedure Notes (Signed)
Procedure Name: Intubation Date/Time: 08/24/2020 7:58 AM Performed by: Marny Lowenstein, CRNA Pre-anesthesia Checklist: Patient identified, Emergency Drugs available, Suction available and Patient being monitored Patient Re-evaluated:Patient Re-evaluated prior to induction Oxygen Delivery Method: Circle system utilized Preoxygenation: Pre-oxygenation with 100% oxygen Induction Type: IV induction Ventilation: Mask ventilation without difficulty Laryngoscope Size: Miller and 2 Grade View: Grade I Tube type: Oral Tube size: 7.0 mm Number of attempts: 1 Airway Equipment and Method: Patient positioned with wedge pillow and Stylet Placement Confirmation: ETT inserted through vocal cords under direct vision,  positive ETCO2 and breath sounds checked- equal and bilateral Secured at: 21 cm Tube secured with: Tape Dental Injury: Teeth and Oropharynx as per pre-operative assessment

## 2020-08-24 NOTE — Anesthesia Preprocedure Evaluation (Signed)
Anesthesia Evaluation  Patient identified by MRN, date of birth, ID band Patient awake    Reviewed: Allergy & Precautions, NPO status , Patient's Chart, lab work & pertinent test results  Airway Mallampati: II  TM Distance: >3 FB Neck ROM: Full    Dental no notable dental hx.    Pulmonary asthma ,    Pulmonary exam normal breath sounds clear to auscultation       Cardiovascular hypertension, Pt. on medications negative cardio ROS Normal cardiovascular exam Rhythm:Regular Rate:Normal     Neuro/Psych  Headaches, Anxiety negative psych ROS   GI/Hepatic negative GI ROS, Neg liver ROS,   Endo/Other  negative endocrine ROS  Renal/GU negative Renal ROS  negative genitourinary   Musculoskeletal negative musculoskeletal ROS (+)   Abdominal   Peds negative pediatric ROS (+)  Hematology negative hematology ROS (+)   Anesthesia Other Findings   Reproductive/Obstetrics negative OB ROS                             Anesthesia Physical Anesthesia Plan  ASA: II  Anesthesia Plan: General   Post-op Pain Management:    Induction: Intravenous  PONV Risk Score and Plan: 3 and Ondansetron, Dexamethasone, Midazolam and Treatment may vary due to age or medical condition  Airway Management Planned: LMA  Additional Equipment:   Intra-op Plan:   Post-operative Plan: Extubation in OR  Informed Consent: I have reviewed the patients History and Physical, chart, labs and discussed the procedure including the risks, benefits and alternatives for the proposed anesthesia with the patient or authorized representative who has indicated his/her understanding and acceptance.     Dental advisory given  Plan Discussed with: CRNA  Anesthesia Plan Comments:         Anesthesia Quick Evaluation

## 2020-08-24 NOTE — Op Note (Signed)
Brooke Walters PROCEDURE DATE: 08/24/2020   PREOPERATIVE DIAGNOSIS:  Undesired fertility and MAB  POSTOPERATIVE DIAGNOSIS:  Undesired fertility and MAB  PROCEDURE:  Laparoscopic left Salpingectomy and suction D & C  SURGEON: Tadd Holtmeyer L. Tieasha Larsen  ASSISTANT:   ANESTHESIA:  General endotracheal  COMPLICATIONS:  None immediate.  ESTIMATED BLOOD LOSS:  100 ml.  FLUIDS: As recorded   URINE OUTPUT:  50 ml of clear urine.  IINDICATIONS: 39 y.o. [id number] with undesired fertility, desires permanent sterilization. Other reversible forms of contraception were discussed with patient; she declines all other modalities.  Risks of procedure discussed with patient including permanence of method, risk of regret, bleeding, infection, injury to surrounding organs and need for additional procedures including laparotomy.  Failure risk less than 0.5% with increased risk of ectopic gestation if pregnancy occurs was also discussed with patient.  Written informed consent was obtained. She also has a MAB a 6 weeks as documented by U/S and desires definite therapy. Suction D & C reviewed with pt.    FINDINGS: 8 week size uterus, normal bilateral ovaries, absent right tube ( history of right ectopic) normal appearing left tube     TECHNIQUE:  The patient was taken to the operating room where general anesthesia was obtained without difficulty.  She was then placed in the dorsal lithotomy position and prepared and draped in sterile fashion.  After an adequate timeout was performed, a bivalved speculum was then placed in the patient's vagina, and the anterior lip of cervix grasped with the single-tooth tenaculum.  The uterine manipulator was then advanced into the uterus.  The speculum was removed from the vagina.  Attention was then turned to the patient's abdomen where a 11-mm skin incision was made in the umbilical fold.  The Optiview 11-mm trocar and sleeve were then advanced without difficulty with the  laparoscope under direct visualization into the abdomen.  The abdomen was then insufflated with carbon dioxide gas.  Adequate pneumoperitoneum was obtained.  A survey of the patient's pelvis and abdomen revealed the findings above. Bilateral 5-mm lower quadrant ports  were then placed under direct visualization.  The left fallopian tube was transected from the uterine attachments and the underlying mesosalpinx with the Harmonic device allowing for salpingectomy.  The fallopian tube was then removed from the abdomen under direct visualization.  The operative site was surveyed, and it was found to be hemostatic.   No intraoperative injury to other surrounding organs was noted.  The abdomen was desufflated and all instruments were then removed from the patient's abdomen. The fascial incision of the umbilicus was closed with a 0 Vicryl figure of eight stitch. All skin incisions were closed with Dermabond.   Attention was then direct to the performance of the suction D & C portion of the procedure. The uterine manipulator was removed. The anterior lip of the cervix was grasped with a tenaculum. The uterus was sounded to 8 weeks. The cervix was then serially dilated to accommodate a # 7 curette. Suction D & C was then performed until a uterine cri was noted. Sharp uterine curettage was performed as well.  The procedure was then completed. The tenaculum was removed, small laceration was noted and repaired with 2/0 Vicryl.  The patient tolerated the procedures well.  Sponge, lap, and needle counts were correct times two.  The patient was then taken to the recovery room awake, extubated and in stable condition.  The patient will be discharged to home as per PACU criteria.  Routine  postoperative instructions given.    Hermina Staggers, MD, FACOG Attending Obstetrician & Gynecologist Faculty Practice, [facility] [place]

## 2020-08-24 NOTE — Anesthesia Postprocedure Evaluation (Signed)
Anesthesia Post Note  Patient: Brooke Walters  Procedure(s) Performed: DILATATION AND EVACUATION (N/A Vagina ) LAPAROSCOPIC LEFT SALPINGECTOMY (Left Abdomen)     Patient location during evaluation: PACU Anesthesia Type: General Level of consciousness: awake and alert Pain management: pain level controlled Vital Signs Assessment: post-procedure vital signs reviewed and stable Respiratory status: spontaneous breathing, nonlabored ventilation and respiratory function stable Cardiovascular status: blood pressure returned to baseline and stable Postop Assessment: no apparent nausea or vomiting Anesthetic complications: no   No complications documented.  Last Vitals:  Vitals:   08/24/20 0945 08/24/20 1015  BP: 122/76 123/79  Pulse: 88 91  Resp: 14 16  Temp:  36.4 C  SpO2: 100% 97%    Last Pain:  Vitals:   08/24/20 0955  TempSrc:   PainSc: Asleep                 Lowella Curb

## 2020-08-25 LAB — SURGICAL PATHOLOGY

## 2020-08-26 ENCOUNTER — Telehealth: Payer: Self-pay

## 2020-08-26 NOTE — Telephone Encounter (Signed)
I have advised patient to at least wait 72 hours before taken a shower and if she feels comfortable to can remove the honey comb plastic. If she has any problems she can call us back.

## 2020-09-03 ENCOUNTER — Encounter (HOSPITAL_BASED_OUTPATIENT_CLINIC_OR_DEPARTMENT_OTHER): Payer: Self-pay | Admitting: Obstetrics and Gynecology

## 2020-09-14 ENCOUNTER — Other Ambulatory Visit: Payer: BC Managed Care – PPO

## 2020-09-14 ENCOUNTER — Other Ambulatory Visit: Payer: Self-pay | Admitting: Sports Medicine

## 2020-09-14 DIAGNOSIS — M25861 Other specified joint disorders, right knee: Secondary | ICD-10-CM

## 2020-09-14 DIAGNOSIS — M25561 Pain in right knee: Secondary | ICD-10-CM

## 2020-09-14 DIAGNOSIS — Z20822 Contact with and (suspected) exposure to covid-19: Secondary | ICD-10-CM

## 2020-09-14 DIAGNOSIS — G8929 Other chronic pain: Secondary | ICD-10-CM

## 2020-09-15 LAB — SARS-COV-2, NAA 2 DAY TAT

## 2020-09-15 LAB — NOVEL CORONAVIRUS, NAA: SARS-CoV-2, NAA: NOT DETECTED

## 2020-09-17 ENCOUNTER — Ambulatory Visit (INDEPENDENT_AMBULATORY_CARE_PROVIDER_SITE_OTHER): Payer: BC Managed Care – PPO | Admitting: Obstetrics and Gynecology

## 2020-09-17 ENCOUNTER — Encounter: Payer: Self-pay | Admitting: Obstetrics and Gynecology

## 2020-09-17 ENCOUNTER — Encounter: Payer: BC Managed Care – PPO | Admitting: Medical

## 2020-09-17 ENCOUNTER — Other Ambulatory Visit: Payer: Self-pay

## 2020-09-17 DIAGNOSIS — Z3009 Encounter for other general counseling and advice on contraception: Secondary | ICD-10-CM

## 2020-09-17 NOTE — Progress Notes (Signed)
Brooke Walters presents for follow up from [facility] and left lap salpingectomy for unwanted fertility. Prior right salpingectomy in past to ectopic pregnancy  Pathology reviewed with pt.  She denies any bowel or bladder dysfunction.  Does not feel well today, received her Covid vaccine booster yesterday.  PE AF VSS Lungs clear Heart RRR Abd soft + BS incisions well healed   A/P Post op visit Return to normal ADL's

## 2020-09-17 NOTE — Patient Instructions (Signed)
Health Maintenance, Female Adopting a healthy lifestyle and getting preventive care are important in promoting health and wellness. Ask your health care provider about:  The right schedule for you to have regular tests and exams.  Things you can do on your own to prevent diseases and keep yourself healthy. What should I know about diet, weight, and exercise? Eat a healthy diet   Eat a diet that includes plenty of vegetables, fruits, low-fat dairy products, and lean protein.  Do not eat a lot of foods that are high in solid fats, added sugars, or sodium. Maintain a healthy weight Body mass index (BMI) is used to identify weight problems. It estimates body fat based on height and weight. Your health care provider can help determine your BMI and help you achieve or maintain a healthy weight. Get regular exercise Get regular exercise. This is one of the most important things you can do for your health. Most adults should:  Exercise for at least 150 minutes each week. The exercise should increase your heart rate and make you sweat (moderate-intensity exercise).  Do strengthening exercises at least twice a week. This is in addition to the moderate-intensity exercise.  Spend less time sitting. Even light physical activity can be beneficial. Watch cholesterol and blood lipids Have your blood tested for lipids and cholesterol at 38 years of age, then have this test every 5 years. Have your cholesterol levels checked more often if:  Your lipid or cholesterol levels are high.  You are older than 38 years of age.  You are at high risk for heart disease. What should I know about cancer screening? Depending on your health history and family history, you may need to have cancer screening at various ages. This may include screening for:  Breast cancer.  Cervical cancer.  Colorectal cancer.  Skin cancer.  Lung cancer. What should I know about heart disease, diabetes, and high blood  pressure? Blood pressure and heart disease  High blood pressure causes heart disease and increases the risk of stroke. This is more likely to develop in people who have high blood pressure readings, are of African descent, or are overweight.  Have your blood pressure checked: ? Every 3-5 years if you are 18-39 years of age. ? Every year if you are 40 years old or older. Diabetes Have regular diabetes screenings. This checks your fasting blood sugar level. Have the screening done:  Once every three years after age 40 if you are at a normal weight and have a low risk for diabetes.  More often and at a younger age if you are overweight or have a high risk for diabetes. What should I know about preventing infection? Hepatitis B If you have a higher risk for hepatitis B, you should be screened for this virus. Talk with your health care provider to find out if you are at risk for hepatitis B infection. Hepatitis C Testing is recommended for:  Everyone born from 1945 through 1965.  Anyone with known risk factors for hepatitis C. Sexually transmitted infections (STIs)  Get screened for STIs, including gonorrhea and chlamydia, if: ? You are sexually active and are younger than 38 years of age. ? You are older than 38 years of age and your health care provider tells you that you are at risk for this type of infection. ? Your sexual activity has changed since you were last screened, and you are at increased risk for chlamydia or gonorrhea. Ask your health care provider if   you are at risk.  Ask your health care provider about whether you are at high risk for HIV. Your health care provider may recommend a prescription medicine to help prevent HIV infection. If you choose to take medicine to prevent HIV, you should first get tested for HIV. You should then be tested every 3 months for as long as you are taking the medicine. Pregnancy  If you are about to stop having your period (premenopausal) and  you may become pregnant, seek counseling before you get pregnant.  Take 400 to 800 micrograms (mcg) of folic acid every day if you become pregnant.  Ask for birth control (contraception) if you want to prevent pregnancy. Osteoporosis and menopause Osteoporosis is a disease in which the bones lose minerals and strength with aging. This can result in bone fractures. If you are 65 years old or older, or if you are at risk for osteoporosis and fractures, ask your health care provider if you should:  Be screened for bone loss.  Take a calcium or vitamin D supplement to lower your risk of fractures.  Be given hormone replacement therapy (HRT) to treat symptoms of menopause. Follow these instructions at home: Lifestyle  Do not use any products that contain nicotine or tobacco, such as cigarettes, e-cigarettes, and chewing tobacco. If you need help quitting, ask your health care provider.  Do not use street drugs.  Do not share needles.  Ask your health care provider for help if you need support or information about quitting drugs. Alcohol use  Do not drink alcohol if: ? Your health care provider tells you not to drink. ? You are pregnant, may be pregnant, or are planning to become pregnant.  If you drink alcohol: ? Limit how much you use to 0-1 drink a day. ? Limit intake if you are breastfeeding.  Be aware of how much alcohol is in your drink. In the [place] one drink equals one 12 oz bottle of beer (355 mL), one 5 oz glass of wine (148 mL), or one 1 oz glass of hard liquor (44 mL). General instructions  Schedule regular health, dental, and eye exams.  Stay current with your vaccines.  Tell your health care provider if: ? You often feel depressed. ? You have ever been abused or do not feel safe at home. Summary  Adopting a healthy lifestyle and getting preventive care are important in promoting health and wellness.  Follow your health care provider's instructions about healthy  diet, exercising, and getting tested or screened for diseases.  Follow your health care provider's instructions on monitoring your cholesterol and blood pressure. This information is not intended to replace advice given to you by your health care provider. Make sure you discuss any questions you have with your health care provider. Document Revised: 11/20/2018 Document Reviewed: 11/20/2018 Elsevier Patient Education  2020 [employer]  

## 2020-10-01 ENCOUNTER — Other Ambulatory Visit: Payer: Self-pay

## 2020-10-01 ENCOUNTER — Ambulatory Visit
Admission: RE | Admit: 2020-10-01 | Discharge: 2020-10-01 | Disposition: A | Discharge status: Home / Self Care | Payer: BC Managed Care – PPO | Source: Ambulatory Visit | Attending: Sports Medicine | Admitting: Sports Medicine

## 2020-10-01 DIAGNOSIS — G8929 Other chronic pain: Secondary | ICD-10-CM | POA: Diagnosis present

## 2020-10-01 DIAGNOSIS — M25861 Other specified joint disorders, right knee: Secondary | ICD-10-CM | POA: Diagnosis present

## 2020-10-01 DIAGNOSIS — M25561 Pain in right knee: Secondary | ICD-10-CM | POA: Diagnosis not present

## 2021-09-16 ENCOUNTER — Other Ambulatory Visit: Payer: Self-pay | Admitting: Orthopedic Surgery

## 2021-09-20 ENCOUNTER — Encounter: Payer: Self-pay | Admitting: Orthopedic Surgery

## 2021-10-07 ENCOUNTER — Encounter: Payer: Self-pay | Admitting: Orthopedic Surgery

## 2021-10-07 ENCOUNTER — Ambulatory Visit
Admission: RE | Admit: 2021-10-07 | Discharge: 2021-10-07 | Disposition: A | Discharge status: Home / Self Care | Payer: BC Managed Care – PPO | Attending: Orthopedic Surgery | Admitting: Orthopedic Surgery

## 2021-10-07 ENCOUNTER — Encounter
Admission: RE | Disposition: A | Discharge status: Home / Self Care | Payer: Self-pay | Source: Home / Self Care | Attending: Orthopedic Surgery

## 2021-10-07 ENCOUNTER — Other Ambulatory Visit: Payer: Self-pay

## 2021-10-07 ENCOUNTER — Ambulatory Visit: Payer: BC Managed Care – PPO | Admitting: Anesthesiology

## 2021-10-07 DIAGNOSIS — I10 Essential (primary) hypertension: Secondary | ICD-10-CM | POA: Insufficient documentation

## 2021-10-07 DIAGNOSIS — J45909 Unspecified asthma, uncomplicated: Secondary | ICD-10-CM | POA: Diagnosis not present

## 2021-10-07 DIAGNOSIS — M948X6 Other specified disorders of cartilage, lower leg: Secondary | ICD-10-CM | POA: Diagnosis not present

## 2021-10-07 DIAGNOSIS — M2410 Other articular cartilage disorders, unspecified site: Secondary | ICD-10-CM | POA: Diagnosis present

## 2021-10-07 HISTORY — PX: KNEE ARTHROSCOPY: SHX127

## 2021-10-07 LAB — POCT PREGNANCY, URINE: Preg Test, Ur: NEGATIVE

## 2021-10-07 SURGERY — ARTHROSCOPY, KNEE
Anesthesia: General | Site: Knee | Laterality: Right

## 2021-10-07 MED ORDER — HYDROCODONE-ACETAMINOPHEN 5-325 MG PO TABS: 1.0000 | ORAL_TABLET | ORAL | 0 refills | Status: DC | PRN

## 2021-10-07 MED ORDER — ACETAMINOPHEN 325 MG PO TABS: 325.0000 mg | ORAL_TABLET | ORAL | Status: DC | PRN

## 2021-10-07 MED ORDER — ACETAMINOPHEN 500 MG PO TABS: 1000.0000 mg | ORAL_TABLET | Freq: Three times a day (TID) | ORAL | 2 refills | Status: DC

## 2021-10-07 MED ORDER — GLYCOPYRROLATE 0.2 MG/ML IJ SOLN
INTRAMUSCULAR | Status: DC | PRN
  Administered 2021-10-07: .1 mg via INTRAVENOUS

## 2021-10-07 MED ORDER — MIDAZOLAM HCL 5 MG/5ML IJ SOLN
INTRAMUSCULAR | Status: DC | PRN
  Administered 2021-10-07 (×2): 1 mg via INTRAVENOUS

## 2021-10-07 MED ORDER — LIDOCAINE HCL (CARDIAC) PF 100 MG/5ML IV SOSY
PREFILLED_SYRINGE | INTRAVENOUS | Status: DC | PRN
  Administered 2021-10-07: 50 mg via INTRATRACHEAL

## 2021-10-07 MED ORDER — FENTANYL CITRATE PF 50 MCG/ML IJ SOSY: 25.0000 ug | PREFILLED_SYRINGE | INTRAMUSCULAR | Status: DC | PRN

## 2021-10-07 MED ORDER — OXYCODONE HCL 5 MG/5ML PO SOLN: 5.0000 mg | Freq: Once | ORAL | Status: DC | PRN

## 2021-10-07 MED ORDER — ACETAMINOPHEN 10 MG/ML IV SOLN
INTRAVENOUS | Status: DC | PRN
Start: 2021-10-07 — End: 2021-10-07
  Administered 2021-10-07: 1000 mg via INTRAVENOUS

## 2021-10-07 MED ORDER — ONDANSETRON HCL 4 MG/2ML IJ SOLN: 4.0000 mg | Freq: Once | INTRAMUSCULAR | Status: DC | PRN

## 2021-10-07 MED ORDER — PROPOFOL 10 MG/ML IV BOLUS
INTRAVENOUS | Status: DC | PRN
Start: 2021-10-07 — End: 2021-10-07
  Administered 2021-10-07: 150 mg via INTRAVENOUS

## 2021-10-07 MED ORDER — ONDANSETRON 4 MG PO TBDP: 4.0000 mg | ORAL_TABLET | Freq: Three times a day (TID) | ORAL | 0 refills | Status: DC | PRN

## 2021-10-07 MED ORDER — SCOPOLAMINE 1 MG/3DAYS TD PT72
1.0000 | MEDICATED_PATCH | Freq: Once | TRANSDERMAL | Status: DC
  Administered 2021-10-07: 1.5 mg via TRANSDERMAL

## 2021-10-07 MED ORDER — ONDANSETRON HCL 4 MG/2ML IJ SOLN
INTRAMUSCULAR | Status: DC | PRN
  Administered 2021-10-07: 4 mg via INTRAVENOUS

## 2021-10-07 MED ORDER — LIDOCAINE-EPINEPHRINE 1 %-1:100000 IJ SOLN
INTRAMUSCULAR | Status: DC | PRN
  Administered 2021-10-07: 5 mL via INTRAMUSCULAR

## 2021-10-07 MED ORDER — ACETAMINOPHEN 160 MG/5ML PO SOLN: 325.0000 mg | ORAL | Status: DC | PRN

## 2021-10-07 MED ORDER — OXYCODONE HCL 5 MG PO TABS: 5.0000 mg | ORAL_TABLET | Freq: Once | ORAL | Status: DC | PRN

## 2021-10-07 MED ORDER — FENTANYL CITRATE (PF) 100 MCG/2ML IJ SOLN
INTRAMUSCULAR | Status: DC | PRN
  Administered 2021-10-07 (×4): 25 ug via INTRAVENOUS

## 2021-10-07 MED ORDER — LACTATED RINGERS IR SOLN
Status: DC | PRN
  Administered 2021-10-07: 12000 mL

## 2021-10-07 MED ORDER — LACTATED RINGERS IV SOLN: INTRAVENOUS | Status: DC

## 2021-10-07 MED ORDER — ASPIRIN EC 325 MG PO TBEC: 325.0000 mg | DELAYED_RELEASE_TABLET | Freq: Every day | ORAL | 0 refills | Status: AC

## 2021-10-07 MED ORDER — DEXAMETHASONE SODIUM PHOSPHATE 4 MG/ML IJ SOLN
INTRAMUSCULAR | Status: DC | PRN
  Administered 2021-10-07: 4 mg via INTRAVENOUS

## 2021-10-07 MED ORDER — CEFAZOLIN SODIUM-DEXTROSE 2-4 GM/100ML-% IV SOLN: 2.0000 g | INTRAVENOUS | Status: DC

## 2021-10-07 SURGICAL SUPPLY — 36 items
ADAPTER IRRIG TUBE 2 SPIKE SOL (ADAPTER) ×6 IMPLANT
ADPR TBG 2 SPK PMP STRL ASCP (ADAPTER) ×4
APL PRP STRL LF DISP 70% ISPRP (MISCELLANEOUS) ×2
BLADE FULL RADIUS 3.5 (BLADE) ×1 IMPLANT
BLADE SHAVER 4.5X7 STR FR (MISCELLANEOUS) ×3 IMPLANT
BLADE SURG SZ11 CARB STEEL (BLADE) ×3 IMPLANT
BNDG ADH 5X4 AIR PERM ELC (GAUZE/BANDAGES/DRESSINGS) ×2
BNDG COHESIVE 4X5 WHT NS (GAUZE/BANDAGES/DRESSINGS) ×3 IMPLANT
BNDG ESMARK 6X12 TAN STRL LF (GAUZE/BANDAGES/DRESSINGS) ×3 IMPLANT
CHLORAPREP W/TINT 26 (MISCELLANEOUS) ×3 IMPLANT
COOLER POLAR GLACIER W/PUMP (MISCELLANEOUS) ×3 IMPLANT
COVER LIGHT HANDLE UNIVERSAL (MISCELLANEOUS) ×6 IMPLANT
DRAPE IMP U-DRAPE 54X76 (DRAPES) ×3 IMPLANT
GAUZE SPONGE 4X4 12PLY STRL (GAUZE/BANDAGES/DRESSINGS) ×3 IMPLANT
GLOVE SURG ENC MOIS LTX SZ7.5 (GLOVE) ×13 IMPLANT
GLOVE SURG UNDER LTX SZ8 (GLOVE) ×9 IMPLANT
GOWN STRL REIN 2XL XLG LVL4 (GOWN DISPOSABLE) ×3 IMPLANT
GOWN STRL REUS W/TWL LRG LVL3 (GOWN DISPOSABLE) ×7 IMPLANT
IV LACTATED RINGER IRRG 3000ML (IV SOLUTION) ×12
IV LR IRRIG 3000ML ARTHROMATIC (IV SOLUTION) ×8 IMPLANT
KIT TURNOVER KIT A (KITS) ×3 IMPLANT
MANIFOLD NEPTUNE II (INSTRUMENTS) ×3 IMPLANT
MAT ABSORB  FLUID 56X50 GRAY (MISCELLANEOUS) ×6
MAT ABSORB FLUID 56X50 GRAY (MISCELLANEOUS) ×3 IMPLANT
PACK ARTHROSCOPY KNEE (MISCELLANEOUS) ×3 IMPLANT
PAD ABD DERMACEA PRESS 5X9 (GAUZE/BANDAGES/DRESSINGS) ×3 IMPLANT
PAD WRAPON POLAR KNEE (MISCELLANEOUS) ×2 IMPLANT
PADDING CAST BLEND 6X4 STRL (MISCELLANEOUS) ×2 IMPLANT
PADDING STRL CAST 6IN (MISCELLANEOUS) ×1
SUT ETHILON 3-0 FS-10 30 BLK (SUTURE) ×3
SUTURE EHLN 3-0 FS-10 30 BLK (SUTURE) ×2 IMPLANT
TOWEL OR 17X26 4PK STRL BLUE (TOWEL DISPOSABLE) ×6 IMPLANT
TUBING INFLOW SET DBFLO PUMP (TUBING) ×3 IMPLANT
TUBING OUTFLOW SET DBLFO PUMP (TUBING) ×3 IMPLANT
WAND WEREWOLF FLOW 90D (MISCELLANEOUS) ×3 IMPLANT
WRAPON POLAR PAD KNEE (MISCELLANEOUS) ×3

## 2021-10-07 NOTE — Anesthesia Preprocedure Evaluation (Signed)
Anesthesia Evaluation  Patient identified by MRN, date of birth, ID band Patient awake    Reviewed: Allergy & Precautions, H&P , NPO status , Patient's Chart, lab work & pertinent test results  Airway Mallampati: II  TM Distance: >3 FB Neck ROM: full    Dental no notable dental hx.    Pulmonary asthma ,    Pulmonary exam normal breath sounds clear to auscultation       Cardiovascular hypertension, Normal cardiovascular exam Rhythm:regular Rate:Normal     Neuro/Psych  Headaches, Anxiety    GI/Hepatic   Endo/Other    Renal/GU      Musculoskeletal   Abdominal   Peds  Hematology   Anesthesia Other Findings   Reproductive/Obstetrics                             Anesthesia Physical Anesthesia Plan  ASA: 2  Anesthesia Plan: General LMA   Post-op Pain Management:    Induction:   PONV Risk Score and Plan: 3 and Treatment may vary due to age or medical condition, Dexamethasone, Ondansetron and Scopolamine patch - Pre-op  Airway Management Planned:   Additional Equipment:   Intra-op Plan:   Post-operative Plan:   Informed Consent: I have reviewed the patients History and Physical, chart, labs and discussed the procedure including the risks, benefits and alternatives for the proposed anesthesia with the patient or authorized representative who has indicated his/her understanding and acceptance.     Dental Advisory Given  Plan Discussed with: CRNA  Anesthesia Plan Comments:         Anesthesia Quick Evaluation

## 2021-10-07 NOTE — Transfer of Care (Signed)
Immediate Anesthesia Transfer of Care Note  Patient: Brooke Walters  Procedure(s) Performed: Right knee arthroscopic patellar debridement medial and MACI biopsy (Right: Knee)  Patient Location: PACU  Anesthesia Type: General LMA  Level of Consciousness: awake, alert  and patient cooperative  Airway and Oxygen Therapy: Patient Spontanous Breathing and Patient connected to supplemental oxygen  Post-op Assessment: Post-op Vital signs reviewed, Patient's Cardiovascular Status Stable, Respiratory Function Stable, Patent Airway and No signs of Nausea or vomiting  Post-op Vital Signs: Reviewed and stable  Complications: No notable events documented.

## 2021-10-07 NOTE — Discharge Instructions (Addendum)
Arthroscopic Knee Surgery   Post-Op Instructions   1. Bracing or crutches: Crutches will be provided at the time of discharge from the surgery center if you do not already have them.   2. Ice: You may be provided with a device [facility]) that allows you to ice the affected area effectively. Otherwise you can ice manually.    3. Driving:  Plan on not driving for at least one week. Please note that you are advised NOT to drive while taking narcotic pain medications as you may be impaired and unsafe to drive.   4. Activity: Ankle pumps several times an hour while awake to prevent blood clots. Weight bearing: as tolerated. Use crutches for as needed (usually ~1 week or less) until pain allows you to ambulate without a limp. Bending and straightening the knee is unlimited. Elevate knee above heart level as much as possible for one week. Avoid standing more than 5 minutes (consecutively) for the first week.  Avoid long distance travel for 2 weeks.   5. Medications:  - You have been provided a prescription for narcotic pain medicine. After surgery, take 1-2 narcotic tablets every 4 hours if needed for severe pain.  - You may take up to 3000mg /day of tylenol (acetaminophen). You can take 1000mg  3x/day. Please check your narcotic. If you have acetaminophen in your narcotic (each tablet will be 325mg ), be careful not to exceed a total of 3000mg /day of acetaminophen.  - A prescription for anti-nausea medication will be provided in case the narcotic medicine causes nausea - take 1 tablet every 6 hours only if nauseated.   - Take enteric coated aspirin 325 mg once daily for 2 weeks to prevent blood clots.   6. Bandages: The physical therapist should change the bandages at the first post-op appointment. If needed, the dressing supplies have been provided to you.   7. Physical Therapy: 1-2 times per week for 6 weeks. Therapy typically starts on post operative Day 3 or 4. You have been provided an order for  physical therapy. The therapist will provide home exercises.   8. Work: May return to full work usually around 2 weeks after 1st post-operative visit. May do light duty/desk job in approximately 1-2 weeks when off of narcotics, pain is well-controlled, and swelling has decreased. Labor intensive jobs may require 4-6 weeks to return.      9. Post-Op Appointments: Your first post-op appointment will be with Dr. in approximately 2 weeks time.    If you find that they have not been scheduled please call the Orthopaedic Appointment front desk at [phone].

## 2021-10-07 NOTE — Op Note (Addendum)
Operative Note    SURGERY DATE: 10/07/2021    PRE-OP DIAGNOSIS:  1.  Right patella chondral defect  POST-OP DIAGNOSIS:  1.  Right patella chondral defect   PROCEDURES:  1. Right knee chondroplasty of the patella 2. Right knee matrix autologous chondrocyte implantation (MACI) biopsy   SURGEON: Cato Mulligan, MD   ASSISTANT: Anitra Lauth, PA; Aquilla Solian, PA-S  ESTIMATED BLOOD LOSS: minimal   TOTAL IV FLUIDS: per anesthesia   INDICATION(S):   Brooke Walters is a 40 y.o. female who has had chronic anterior knee pain for over 1 year.  She has failed extensive nonoperative management including activity modifications, formal physical therapy, medications, and knee injections of corticosteroid and viscosupplementation.  Clinical exam and MRI were consistent with medial patellar chondral defect.  After discussion of risks, benefits, and alternatives to surgery, the patient elected to proceed with two stage surgery. The first stage consists of diagnostic knee arthroscopy, chondroplasty of cartilage defect, and MACI biopsy. This will be performed today. The second stage will consist of MACI implantation to the patella.   OPERATIVE FINDINGS:    Examination under anesthesia: A careful examination under anesthesia was performed.  Passive range of motion was: Hyperextension: 2.  Extension: 0.  Flexion: 135.  Lachman: normal. Pivot Shift: normal.  Posterior drawer: normal.  Varus stability in full extension: normal.  Varus stability in 30 degrees of flexion: normal.  Valgus stability in full extension: normal.  Valgus stability in 30 degrees of flexion: normal. Patellar mobility: 2 quadrants laterally, 2 quadrants medially.   Intra-operative findings: A thorough arthroscopic examination of the knee was performed.  The findings are: 1. Suprapatellar pouch: Normal 2. Undersurface of median ridge: 1 softening 3. Medial patellar facet: Grade 4 chondral defect measuring 18 x 13 mm;  extension from just lateral to the medial patellar border to the median ridge 4. Lateral patellar facet: Grade 1 softening 5. Trochlea: Normal 6. Lateral gutter/popliteus tendon: Normal 7. Hoffa's fat pad: Normal 8. Medial gutter/plica: Normal 9. ACL: Normal 10. PCL: Normal 11. Medial meniscus: Normal 12. Medial compartment cartilage: Normal 13. Lateral meniscus: Normal 14. Lateral compartment cartilage:  Normal   OPERATIVE REPORT:     I identified Brooke Walters in the pre-operative holding area. I marked the operative knee with my initials. I reviewed the risks and benefits of the proposed surgical intervention and the patient wished to proceed. The patient was transferred to the operative suite and placed in the supine position with all bony prominences padded.  Anesthesia was administered. Appropriate IV antibiotics were administered prior to incision. The extremity was then prepped and draped in standard fashion.  A tourniquet was placed.  A time out was performed confirming the correct extremity, correct patient, and correct procedure.   Arthroscopy portals were marked. Local anesthetic was injected to the planned portal sites. The anterolateral portal was established with an 11 blade.      The arthroscope was placed in the anterolateral portal and then into the suprapatellar pouch. Next, the medial portal was established under needle localization. A diagnostic knee scope was completed with the above findings. The chondral defect was identified.  A combination of an arthroscopic biter and oscillating shaver were used to debride the chondral defect such that there were stable edges.  Lesion size as indicated above.   Using combination of narrow osteotomes and a grasper, cartilage biopsy was performed from the lateral wall of the intercondylar notch as this is a nonweightbearing region.  These were sent with the appropriate MACI biopsy kit and appropriate documentation.   The  portals were closed with 3-0 Nylon suture. Sterile dressings included Xeroform, 4x4s, Sof-Rol, and Bias wrap. A Polarcare was placed.  The patient was then awakened and taken to the PACU hemodynamically stable without complication.   Of note, assistance from a PA was essential to performing the surgery.  PA was present for the entire surgery.  PA assisted with patient positioning, instrumentation, and wound closure. The surgery would have been more difficult and had longer operative time without PA assistance.     POSTOPERATIVE PLAN: The patient will be discharged home today once they meet PACU criteria.  Patient with allergy to aspirin so we will omit aspirin for DVT prophylaxis.  Weight-bearing as tolerated. Follow up in 2 weeks per protocol. Physical therapy will start on POD#3-4.  Plan will be for MACI implantation in approximately 7 weeks.

## 2021-10-07 NOTE — Anesthesia Postprocedure Evaluation (Signed)
Anesthesia Post Note  Patient: Brooke Walters  Procedure(s) Performed: Right knee arthroscopic patellar debridement medial and MACI biopsy (Right: Knee)     Patient location during evaluation: PACU Anesthesia Type: General Level of consciousness: awake and alert and oriented Pain management: satisfactory to patient Vital Signs Assessment: post-procedure vital signs reviewed and stable Respiratory status: spontaneous breathing, nonlabored ventilation and respiratory function stable Cardiovascular status: blood pressure returned to baseline and stable Postop Assessment: Adequate PO intake and No signs of nausea or vomiting Anesthetic complications: no   No notable events documented.  Cherly [place]

## 2021-10-07 NOTE — H&P (Signed)
Paper H&P to be scanned into permanent record. H&P reviewed. No significant changes noted.  

## 2021-10-07 NOTE — Anesthesia Procedure Notes (Signed)
Procedure Name: LMA Insertion Date/Time: 10/07/2021 11:28 AM Performed by: Maree Krabbe, CRNA Pre-anesthesia Checklist: Patient identified, Emergency Drugs available, Suction available, Timeout performed and Patient being monitored Patient Re-evaluated:Patient Re-evaluated prior to induction Oxygen Delivery Method: Circle system utilized Preoxygenation: Pre-oxygenation with 100% oxygen Induction Type: IV induction LMA: LMA inserted LMA Size: 4.0 Number of attempts: 1 Placement Confirmation: positive ETCO2 and breath sounds checked- equal and bilateral Tube secured with: Tape Dental Injury: Teeth and Oropharynx as per pre-operative assessment

## 2021-10-10 ENCOUNTER — Encounter: Payer: Self-pay | Admitting: Orthopedic Surgery

## 2021-10-26 ENCOUNTER — Other Ambulatory Visit: Payer: Self-pay | Admitting: Orthopedic Surgery

## 2021-10-31 ENCOUNTER — Other Ambulatory Visit: Payer: Self-pay | Admitting: Orthopedic Surgery

## 2021-10-31 DIAGNOSIS — M25562 Pain in left knee: Secondary | ICD-10-CM

## 2021-11-11 ENCOUNTER — Other Ambulatory Visit: Payer: Self-pay | Admitting: Orthopedic Surgery

## 2021-11-11 ENCOUNTER — Encounter
Admission: RE | Admit: 2021-11-11 | Discharge: 2021-11-11 | Disposition: A | Discharge status: Home / Self Care | Payer: BC Managed Care – PPO | Source: Ambulatory Visit | Attending: Orthopedic Surgery | Admitting: Orthopedic Surgery

## 2021-11-11 ENCOUNTER — Other Ambulatory Visit: Payer: Self-pay

## 2021-11-11 VITALS — BP 106/85 | HR 112 | Resp 16

## 2021-11-11 DIAGNOSIS — Z01812 Encounter for preprocedural laboratory examination: Secondary | ICD-10-CM

## 2021-11-11 DIAGNOSIS — Z01818 Encounter for other preprocedural examination: Secondary | ICD-10-CM | POA: Insufficient documentation

## 2021-11-11 DIAGNOSIS — M25562 Pain in left knee: Secondary | ICD-10-CM

## 2021-11-11 HISTORY — DX: Depression, unspecified: F32.A

## 2021-11-11 HISTORY — DX: Dyspnea, unspecified: R06.00

## 2021-11-11 LAB — CBC WITH DIFFERENTIAL/PLATELET
Abs Immature Granulocytes: 0.01 10*3/uL (ref 0.00–0.07)
Basophils Absolute: 0 10*3/uL (ref 0.0–0.1)
Basophils Relative: 1 %
Eosinophils Absolute: 0.4 10*3/uL (ref 0.0–0.5)
Eosinophils Relative: 8 %
HCT: 40.8 % (ref 36.0–46.0)
Hemoglobin: 13.5 g/dL (ref 12.0–15.0)
Immature Granulocytes: 0 %
Lymphocytes Relative: 39 %
Lymphs Abs: 1.9 10*3/uL (ref 0.7–4.0)
MCH: 32.5 pg (ref 26.0–34.0)
MCHC: 33.1 g/dL (ref 30.0–36.0)
MCV: 98.3 fL (ref 80.0–100.0)
Monocytes Absolute: 0.3 10*3/uL (ref 0.1–1.0)
Monocytes Relative: 6 %
Neutro Abs: 2.3 10*3/uL (ref 1.7–7.7)
Neutrophils Relative %: 46 %
Platelets: 181 10*3/uL (ref 150–400)
RBC: 4.15 MIL/uL (ref 3.87–5.11)
RDW: 11.9 % (ref 11.5–15.5)
WBC: 5 10*3/uL (ref 4.0–10.5)
nRBC: 0 % (ref 0.0–0.2)

## 2021-11-11 LAB — URINALYSIS, ROUTINE W REFLEX MICROSCOPIC
Bacteria, UA: NONE SEEN
Bilirubin Urine: NEGATIVE
Glucose, UA: NEGATIVE mg/dL
Ketones, ur: NEGATIVE mg/dL
Leukocytes,Ua: NEGATIVE
Nitrite: NEGATIVE
Protein, ur: 30 mg/dL — AB
RBC / HPF: >50 RBC/hpf — ABNORMAL HIGH (ref 0–5)
Specific Gravity, Urine: 1.034 — ABNORMAL HIGH (ref 1.005–1.030)
pH: 5 (ref 5.0–8.0)

## 2021-11-11 LAB — TYPE AND SCREEN
ABO/RH(D): A POS
Antibody Screen: NEGATIVE

## 2021-11-11 LAB — COMPREHENSIVE METABOLIC PANEL
ALT: 22 U/L (ref 0–44)
AST: 39 U/L (ref 15–41)
Albumin: 4.4 g/dL (ref 3.5–5.0)
Alkaline Phosphatase: 48 U/L (ref 38–126)
Anion gap: 5 (ref 5–15)
BUN: 22 mg/dL — ABNORMAL HIGH (ref 6–20)
CO2: 26 mmol/L (ref 22–32)
Calcium: 9 mg/dL (ref 8.9–10.3)
Chloride: 104 mmol/L (ref 98–111)
Creatinine, Ser: 0.85 mg/dL (ref 0.44–1.00)
GFR, Estimated: >60 mL/min (ref >60)
Glucose, Bld: 85 mg/dL (ref 70–99)
Potassium: 3.6 mmol/L (ref 3.5–5.1)
Sodium: 135 mmol/L (ref 135–145)
Total Bilirubin: 1.2 mg/dL (ref 0.3–1.2)
Total Protein: 7.8 g/dL (ref 6.5–8.1)

## 2021-11-11 LAB — SURGICAL PCR SCREEN
MRSA, PCR: NEGATIVE
Staphylococcus aureus: NEGATIVE

## 2021-11-11 NOTE — Patient Instructions (Addendum)
Your procedure is scheduled on: 11/25/21 - Friday Report to the Registration Desk on the 1st floor of [facility]. To find out your arrival time, please call [phone] between 1PM - 3PM on: 11/24/21 - Thursday Report to [facility] on 11/23/21 at 8 AM.  REMEMBER: Instructions that are not followed completely may result in serious medical risk, up to and including death; or upon the discretion of your surgeon and anesthesiologist your surgery may need to be rescheduled.  Do not eat food after midnight the night before surgery.  No gum chewing, lozengers or hard candies.  You may however, drink CLEAR liquids up to 2 hours before you are scheduled to arrive for your surgery. Do not drink anything within 2 hours of your scheduled arrival time.  Clear liquids include: - water  - apple juice without pulp - gatorade (not RED, PURPLE, OR BLUE) - black coffee or tea (Do NOT add milk or creamers to the coffee or tea) Do NOT drink anything that is not on this list.  In addition, your doctor has ordered for you to drink the provided  Ensure Pre-Surgery Clear Carbohydrate Drink  Drinking this carbohydrate drink up to two hours before surgery helps to reduce insulin resistance and improve patient outcomes. Please complete drinking 2 hours prior to scheduled arrival time.  TAKE THESE MEDICATIONS THE MORNING OF SURGERY WITH A SIP OF WATER:  Use  levalbuterol (XOPENEX HFA) 45 MCG/ACT inhaleron the day of surgery and bring to the hospital.  One week prior to surgery: Stop Anti-inflammatories (NSAIDS) such as Advil, Aleve, Ibuprofen, Motrin, Naproxen, Naprosyn and Aspirin based products such as Excedrin, Goodys Powder, BC Powder.  Stop ANY OVER THE COUNTER supplements until after surgery.  You may however, continue to take Tylenol if needed for pain up until the day of surgery.  No Alcohol for 24 hours before or after surgery.  No Smoking including e-cigarettes for 24 hours prior  to surgery.  No chewable tobacco products for at least 6 hours prior to surgery.  No nicotine patches on the day of surgery.  Do not use any "recreational" drugs for at least a week prior to your surgery.  Please be advised that the combination of cocaine and anesthesia may have negative outcomes, up to and including death. If you test positive for cocaine, your surgery will be cancelled.  On the morning of surgery brush your teeth with toothpaste and water, you may rinse your mouth with mouthwash if you wish. Do not swallow any toothpaste or mouthwash.  Use CHG Soap or wipes as directed on instruction sheet.  Do not wear jewelry, make-up, hairpins, clips or nail polish.  Do not wear lotions, powders, or perfumes.   Do not shave body from the neck down 48 hours prior to surgery just in case you cut yourself which could leave a site for infection.  Also, freshly shaved skin may become irritated if using the CHG soap.  Contact lenses, hearing aids and dentures may not be worn into surgery.  Do not bring valuables to the hospital. [facility] is not responsible for any missing/lost belongings or valuables.   Notify your doctor if there is any change in your medical condition (cold, fever, infection).  Wear comfortable clothing (specific to your surgery type) to the hospital.  After surgery, you can help prevent lung complications by doing breathing exercises.  Take deep breaths and cough every 1-2 hours. Your doctor may order a device called an Incentive  Spirometer to help you take deep breaths. When coughing or sneezing, hold a pillow firmly against your incision with both hands. This is called "splinting." Doing this helps protect your incision. It also decreases belly discomfort.  If you are being admitted to the hospital overnight, leave your suitcase in the car. After surgery it may be brought to your room.  If you are being discharged the day of surgery, you will not be allowed  to drive home. You will need a responsible adult (18 years or older) to drive you home and stay with you that night.   If you are taking public transportation, you will need to have a responsible adult (18 years or older) with you. Please confirm with your physician that it is acceptable to use public transportation.   Please call the Pre-admissions Testing Dept. at [phone] if you have any questions about these instructions.  Surgery Visitation Policy:  Patients undergoing a surgery or procedure may have one family member or support person with them as long as that person is not COVID-19 positive or experiencing its symptoms.  That person may remain in the waiting area during the procedure and may rotate out with other people.  Inpatient Visitation:    Visiting hours are 7 a.m. to 8 p.m. Up to two visitors ages 16+ are allowed at one time in a patient room. The visitors may rotate out with other people during the day. Visitors must check out when they leave, or other visitors will not be allowed. One designated support person may remain overnight. The visitor must pass COVID-19 screenings, use hand sanitizer when entering and exiting the patient's room and wear a mask at all times, including in the patient's room. Patients must also wear a mask when staff or their visitor are in the room. Masking is required regardless of vaccination status.

## 2021-11-16 ENCOUNTER — Ambulatory Visit: Payer: BC Managed Care – PPO

## 2021-11-20 ENCOUNTER — Ambulatory Visit: Payer: BC Managed Care – PPO

## 2021-11-23 ENCOUNTER — Other Ambulatory Visit: Payer: BC Managed Care – PPO

## 2021-11-23 ENCOUNTER — Other Ambulatory Visit (HOSPITAL_COMMUNITY)
Admission: RE | Admit: 2021-11-23 | Discharge: 2021-11-23 | Disposition: A | Discharge status: Home / Self Care | Payer: BC Managed Care – PPO | Source: Ambulatory Visit | Attending: Orthopedic Surgery | Admitting: Orthopedic Surgery

## 2021-11-23 DIAGNOSIS — Z01818 Encounter for other preprocedural examination: Secondary | ICD-10-CM

## 2021-11-23 DIAGNOSIS — Z20822 Contact with and (suspected) exposure to covid-19: Secondary | ICD-10-CM | POA: Insufficient documentation

## 2021-11-23 DIAGNOSIS — Z01812 Encounter for preprocedural laboratory examination: Secondary | ICD-10-CM | POA: Diagnosis not present

## 2021-11-23 LAB — SARS CORONAVIRUS 2 (TAT 6-24 HRS): SARS Coronavirus 2: NEGATIVE

## 2021-11-25 ENCOUNTER — Ambulatory Visit: Payer: BC Managed Care – PPO | Admitting: Anesthesiology

## 2021-11-25 ENCOUNTER — Encounter
Admission: RE | Disposition: A | Discharge status: Home / Self Care | Payer: Self-pay | Source: Home / Self Care | Attending: Orthopedic Surgery

## 2021-11-25 ENCOUNTER — Other Ambulatory Visit: Payer: Self-pay

## 2021-11-25 ENCOUNTER — Ambulatory Visit: Payer: BC Managed Care – PPO

## 2021-11-25 ENCOUNTER — Ambulatory Visit
Admission: RE | Admit: 2021-11-25 | Discharge: 2021-11-25 | Disposition: A | Discharge status: Home / Self Care | Payer: BC Managed Care – PPO | Attending: Orthopedic Surgery | Admitting: Orthopedic Surgery

## 2021-11-25 DIAGNOSIS — F419 Anxiety disorder, unspecified: Secondary | ICD-10-CM | POA: Diagnosis not present

## 2021-11-25 DIAGNOSIS — J45909 Unspecified asthma, uncomplicated: Secondary | ICD-10-CM | POA: Diagnosis not present

## 2021-11-25 DIAGNOSIS — I10 Essential (primary) hypertension: Secondary | ICD-10-CM | POA: Diagnosis not present

## 2021-11-25 DIAGNOSIS — M948X6 Other specified disorders of cartilage, lower leg: Secondary | ICD-10-CM | POA: Insufficient documentation

## 2021-11-25 DIAGNOSIS — G8918 Other acute postprocedural pain: Secondary | ICD-10-CM

## 2021-11-25 DIAGNOSIS — F32A Depression, unspecified: Secondary | ICD-10-CM | POA: Diagnosis not present

## 2021-11-25 DIAGNOSIS — Z419 Encounter for procedure for purposes other than remedying health state, unspecified: Secondary | ICD-10-CM

## 2021-11-25 LAB — POCT PREGNANCY, URINE: Preg Test, Ur: NEGATIVE

## 2021-11-25 SURGERY — IMPLANTATION, MATRIX-INDUCED AUTOLOGOUS CHONDROCYTES
Anesthesia: General | Site: Knee | Laterality: Right

## 2021-11-25 MED ORDER — PROPOFOL 10 MG/ML IV BOLUS
INTRAVENOUS | Status: DC | PRN
  Administered 2021-11-25: 150 mg via INTRAVENOUS

## 2021-11-25 MED ORDER — BUPIVACAINE HCL (PF) 0.5 % IJ SOLN
INTRAMUSCULAR | Status: AC
  Filled 2021-11-25: qty 30

## 2021-11-25 MED ORDER — PROPOFOL 10 MG/ML IV BOLUS
INTRAVENOUS | Status: AC
  Filled 2021-11-25: qty 20

## 2021-11-25 MED ORDER — FENTANYL CITRATE (PF) 100 MCG/2ML IJ SOLN
INTRAMUSCULAR | Status: DC | PRN
  Administered 2021-11-25 (×2): 50 ug via INTRAVENOUS
  Administered 2021-11-25: 25 ug via INTRAVENOUS

## 2021-11-25 MED ORDER — ACETAMINOPHEN 10 MG/ML IV SOLN: 1000.0000 mg | Freq: Once | INTRAVENOUS | Status: DC | PRN

## 2021-11-25 MED ORDER — LIDOCAINE HCL (CARDIAC) PF 100 MG/5ML IV SOSY
PREFILLED_SYRINGE | INTRAVENOUS | Status: DC | PRN
  Administered 2021-11-25: 60 mg via INTRAVENOUS

## 2021-11-25 MED ORDER — FENTANYL CITRATE (PF) 100 MCG/2ML IJ SOLN: 25.0000 ug | INTRAMUSCULAR | Status: DC | PRN

## 2021-11-25 MED ORDER — BUPIVACAINE HCL (PF) 0.5 % IJ SOLN
INTRAMUSCULAR | Status: AC
  Filled 2021-11-25: qty 20

## 2021-11-25 MED ORDER — ROCURONIUM BROMIDE 10 MG/ML (PF) SYRINGE
PREFILLED_SYRINGE | INTRAVENOUS | Status: AC
  Filled 2021-11-25: qty 10

## 2021-11-25 MED ORDER — LIDOCAINE-EPINEPHRINE 1 %-1:100000 IJ SOLN
INTRAMUSCULAR | Status: AC
  Filled 2021-11-25: qty 1

## 2021-11-25 MED ORDER — THROMBIN 5000 UNITS EX SOLR
CUTANEOUS | Status: DC | PRN
  Administered 2021-11-25 (×2): 5000 [IU] via TOPICAL

## 2021-11-25 MED ORDER — ACETAMINOPHEN 10 MG/ML IV SOLN
INTRAVENOUS | Status: DC | PRN
  Administered 2021-11-25: 1000 mg via INTRAVENOUS

## 2021-11-25 MED ORDER — MIDAZOLAM HCL 2 MG/2ML IJ SOLN
INTRAMUSCULAR | Status: AC
  Filled 2021-11-25: qty 2

## 2021-11-25 MED ORDER — ROCURONIUM BROMIDE 100 MG/10ML IV SOLN
INTRAVENOUS | Status: DC | PRN
  Administered 2021-11-25: 40 mg via INTRAVENOUS

## 2021-11-25 MED ORDER — DEXMEDETOMIDINE HCL IN NACL 200 MCG/50ML IV SOLN
INTRAVENOUS | Status: DC | PRN
  Administered 2021-11-25 (×2): 10 ug via INTRAVENOUS

## 2021-11-25 MED ORDER — THROMBIN 5000 UNITS EX SOLR
CUTANEOUS | Status: AC
  Filled 2021-11-25: qty 5000

## 2021-11-25 MED ORDER — LIDOCAINE HCL (PF) 2 % IJ SOLN
INTRAMUSCULAR | Status: AC
  Filled 2021-11-25: qty 5

## 2021-11-25 MED ORDER — OXYCODONE HCL 5 MG PO TABS: 5.0000 mg | ORAL_TABLET | ORAL | 0 refills | Status: AC | PRN

## 2021-11-25 MED ORDER — ONDANSETRON HCL 4 MG/2ML IJ SOLN
INTRAMUSCULAR | Status: AC
  Filled 2021-11-25: qty 2

## 2021-11-25 MED ORDER — CEFAZOLIN SODIUM-DEXTROSE 2-4 GM/100ML-% IV SOLN
INTRAVENOUS | Status: AC
  Filled 2021-11-25: qty 100

## 2021-11-25 MED ORDER — BUPIVACAINE HCL (PF) 0.5 % IJ SOLN
INTRAMUSCULAR | Status: DC | PRN
  Administered 2021-11-25: 10 mL via PERINEURAL

## 2021-11-25 MED ORDER — OXYCODONE HCL 5 MG/5ML PO SOLN: 5.0000 mg | Freq: Once | ORAL | Status: AC | PRN

## 2021-11-25 MED ORDER — LACTATED RINGERS IV SOLN: INTRAVENOUS | Status: DC

## 2021-11-25 MED ORDER — OXYCODONE HCL 5 MG PO TABS
5.0000 mg | ORAL_TABLET | Freq: Once | ORAL | Status: AC | PRN
  Administered 2021-11-25: 5 mg via ORAL

## 2021-11-25 MED ORDER — BUPIVACAINE LIPOSOME 1.3 % IJ SUSP
INTRAMUSCULAR | Status: DC | PRN
  Administered 2021-11-25: 40 mL via INTRAMUSCULAR

## 2021-11-25 MED ORDER — SODIUM CHLORIDE FLUSH 0.9 % IV SOLN
INTRAVENOUS | Status: AC
  Filled 2021-11-25: qty 3

## 2021-11-25 MED ORDER — OXYCODONE HCL 5 MG PO TABS
ORAL_TABLET | ORAL | Status: AC
  Administered 2021-11-25: 5 mg via ORAL
  Filled 2021-11-25: qty 1

## 2021-11-25 MED ORDER — CEFAZOLIN SODIUM-DEXTROSE 2-3 GM-%(50ML) IV SOLR
INTRAVENOUS | Status: DC | PRN
  Administered 2021-11-25: 2 g via INTRAVENOUS

## 2021-11-25 MED ORDER — MIDAZOLAM HCL 2 MG/2ML IJ SOLN: 2.0000 mg | Freq: Once | INTRAMUSCULAR | Status: AC

## 2021-11-25 MED ORDER — FENTANYL CITRATE (PF) 100 MCG/2ML IJ SOLN
INTRAMUSCULAR | Status: AC
  Filled 2021-11-25: qty 2

## 2021-11-25 MED ORDER — PHENYLEPHRINE HCL (PRESSORS) 10 MG/ML IV SOLN
INTRAVENOUS | Status: DC | PRN
  Administered 2021-11-25: 100 ug via INTRAVENOUS

## 2021-11-25 MED ORDER — SUGAMMADEX SODIUM 200 MG/2ML IV SOLN
INTRAVENOUS | Status: DC | PRN
Start: 2021-11-25 — End: 2021-11-25
  Administered 2021-11-25: 200 mg via INTRAVENOUS

## 2021-11-25 MED ORDER — LIDOCAINE HCL (PF) 1 % IJ SOLN
INTRAMUSCULAR | Status: DC | PRN
  Administered 2021-11-25: 1 mL via SUBCUTANEOUS

## 2021-11-25 MED ORDER — PROMETHAZINE HCL 25 MG/ML IJ SOLN: 6.2500 mg | INTRAMUSCULAR | Status: DC | PRN

## 2021-11-25 MED ORDER — ACETAMINOPHEN 500 MG PO TABS: 1000.0000 mg | ORAL_TABLET | Freq: Three times a day (TID) | ORAL | 2 refills | Status: AC

## 2021-11-25 MED ORDER — 0.9 % SODIUM CHLORIDE (POUR BTL) OPTIME
TOPICAL | Status: DC | PRN
  Administered 2021-11-25: 500 mL

## 2021-11-25 MED ORDER — ONDANSETRON 4 MG PO TBDP: 4.0000 mg | ORAL_TABLET | Freq: Three times a day (TID) | ORAL | 0 refills | Status: DC | PRN

## 2021-11-25 MED ORDER — HEMOSTATIC AGENTS (NO CHARGE) OPTIME
TOPICAL | Status: DC | PRN
  Administered 2021-11-25: 1 via TOPICAL

## 2021-11-25 MED ORDER — DEXAMETHASONE SODIUM PHOSPHATE 10 MG/ML IJ SOLN
INTRAMUSCULAR | Status: DC | PRN
  Administered 2021-11-25: 10 mg via INTRAVENOUS

## 2021-11-25 MED ORDER — ASPIRIN EC 325 MG PO TBEC: 325.0000 mg | DELAYED_RELEASE_TABLET | Freq: Every day | ORAL | 0 refills | Status: AC

## 2021-11-25 MED ORDER — CEFAZOLIN SODIUM-DEXTROSE 2-4 GM/100ML-% IV SOLN: 2.0000 g | INTRAVENOUS | Status: DC

## 2021-11-25 MED ORDER — LIDOCAINE HCL (PF) 1 % IJ SOLN
INTRAMUSCULAR | Status: AC
  Filled 2021-11-25: qty 2

## 2021-11-25 MED ORDER — MIDAZOLAM HCL 2 MG/2ML IJ SOLN
INTRAMUSCULAR | Status: AC
  Administered 2021-11-25: 2 mg via INTRAVENOUS
  Filled 2021-11-25: qty 2

## 2021-11-25 MED ORDER — CHLORHEXIDINE GLUCONATE 0.12 % MT SOLN
OROMUCOSAL | Status: AC
  Administered 2021-11-25: 15 mL
  Filled 2021-11-25: qty 15

## 2021-11-25 MED ORDER — OXYCODONE HCL 5 MG PO TABS: 5.0000 mg | ORAL_TABLET | Freq: Once | ORAL | Status: DC

## 2021-11-25 MED ORDER — OXYCODONE HCL 5 MG PO TABS
ORAL_TABLET | ORAL | Status: AC
  Filled 2021-11-25: qty 1

## 2021-11-25 MED ORDER — ONDANSETRON HCL 4 MG/2ML IJ SOLN
INTRAMUSCULAR | Status: DC | PRN
  Administered 2021-11-25: 4 mg via INTRAVENOUS

## 2021-11-25 MED ORDER — ACETAMINOPHEN 10 MG/ML IV SOLN
INTRAVENOUS | Status: AC
  Filled 2021-11-25: qty 100

## 2021-11-25 SURGICAL SUPPLY — 86 items
ADH SKN CLS APL DERMABOND .7 (GAUZE/BANDAGES/DRESSINGS) ×1
APL PRP FLT STRL LF DISP 70% (MISCELLANEOUS) ×4
APL PRP STRL LF DISP 70% ISPRP (MISCELLANEOUS)
APL SWBSTK 6 STRL LF DISP (MISCELLANEOUS)
APPLICATOR CHLORAPREP 10 TEAL (MISCELLANEOUS) ×8 IMPLANT
APPLICATOR COTTON TIP 6 STRL (MISCELLANEOUS) ×2 IMPLANT
APPLICATOR COTTON TIP 6IN STRL (MISCELLANEOUS) IMPLANT
BLADE OSCILLATING/SAGITTAL (BLADE)
BLADE SURG 15 STRL LF DISP TIS (BLADE) ×1 IMPLANT
BLADE SURG 15 STRL SS (BLADE) ×3
BLADE SW THK.38XMED LNG THN (BLADE) ×1 IMPLANT
BNDG ELASTIC 4X5.8 VLCR STR LF (GAUZE/BANDAGES/DRESSINGS) ×2 IMPLANT
BNDG ELASTIC 6X5.8 VLCR STR LF (GAUZE/BANDAGES/DRESSINGS) ×2 IMPLANT
BNDG ESMARK 6X12 TAN STRL LF (GAUZE/BANDAGES/DRESSINGS) ×2 IMPLANT
CAST PADDING 6X4YD ST 30248 (SOFTGOODS) ×2
CHLORAPREP W/TINT 26 (MISCELLANEOUS) ×1 IMPLANT
COOLER POLAR GLACIER W/PUMP (MISCELLANEOUS) ×2 IMPLANT
COUNTER NEEDLE 20/40 LG (NEEDLE) ×3 IMPLANT
COVER MAYO STAND REUSABLE (DRAPES) ×1 IMPLANT
CUFF TOURN SGL QUICK 24 (TOURNIQUET CUFF)
CUFF TOURN SGL QUICK 34 (TOURNIQUET CUFF)
CUFF TRNQT CYL 24X4X16.5-23 (TOURNIQUET CUFF) IMPLANT
CUFF TRNQT CYL 34X4.125X (TOURNIQUET CUFF) IMPLANT
DERMABOND ADVANCED (GAUZE/BANDAGES/DRESSINGS) ×2
DERMABOND ADVANCED .7 DNX12 (GAUZE/BANDAGES/DRESSINGS) IMPLANT
DRAPE 3/4 80X56 (DRAPES) ×6 IMPLANT
DRAPE C-ARM XRAY 36X54 (DRAPES) ×1 IMPLANT
DRAPE C-ARMOR (DRAPES) ×1 IMPLANT
DRAPE INCISE IOBAN 66X45 STRL (DRAPES) ×2 IMPLANT
DRSG TEGADERM 2-3/8X2-3/4 SM (GAUZE/BANDAGES/DRESSINGS) ×1 IMPLANT
DRSG TEGADERM 4X4.75 (GAUZE/BANDAGES/DRESSINGS) ×1 IMPLANT
ELECT REM PT RETURN 9FT ADLT (ELECTROSURGICAL) ×3
ELECTRODE REM PT RTRN 9FT ADLT (ELECTROSURGICAL) ×1 IMPLANT
GAUZE 4X4 16PLY ~~LOC~~+RFID DBL (SPONGE) ×1 IMPLANT
GLOVE SRG 8 PF TXTR STRL LF DI (GLOVE) ×2 IMPLANT
GLOVE SURG SYN 7.5  E (GLOVE) ×9
GLOVE SURG SYN 7.5 E (GLOVE) ×3 IMPLANT
GLOVE SURG SYN 7.5 PF PI (GLOVE) ×3 IMPLANT
GLOVE SURG UNDER POLY LF SZ8 (GLOVE) ×6
GOWN STRL REUS W/ TWL LRG LVL3 (GOWN DISPOSABLE) ×1 IMPLANT
GOWN STRL REUS W/ TWL XL LVL3 (GOWN DISPOSABLE) ×2 IMPLANT
GOWN STRL REUS W/TWL LRG LVL3 (GOWN DISPOSABLE) ×3
GOWN STRL REUS W/TWL XL LVL3 (GOWN DISPOSABLE) ×6
GRADUATE 1200CC STRL 31836 (MISCELLANEOUS) ×4 IMPLANT
HEMOVAC 400CC 10FR (MISCELLANEOUS) ×2 IMPLANT
KIT TURNOVER KIT A (KITS) ×3 IMPLANT
MACI AUTOLOGOUS CELL SCAFFOLD (Tissue) ×3 IMPLANT
MANIFOLD NEPTUNE II (INSTRUMENTS) ×3 IMPLANT
NDL KEITH SZ2.5 (NEEDLE) ×2 IMPLANT
NDL SPNL 18GX3.5 QUINCKE PK (NEEDLE) IMPLANT
NEEDLE SPNL 18GX3.5 QUINCKE PK (NEEDLE) ×3 IMPLANT
NS IRRIG 1000ML POUR BTL (IV SOLUTION) ×1 IMPLANT
NS IRRIG 500ML POUR BTL (IV SOLUTION) ×2 IMPLANT
PACK EXTREMITY ARMC (MISCELLANEOUS) ×2 IMPLANT
PACK TOTAL KNEE (MISCELLANEOUS) ×1 IMPLANT
PAD ABD DERMACEA PRESS 5X9 (GAUZE/BANDAGES/DRESSINGS) ×2 IMPLANT
PAD CAST CTTN 4X4 STRL (SOFTGOODS) IMPLANT
PAD WRAPON POLAR KNEE (MISCELLANEOUS) IMPLANT
PADDING CAST COTTON 4X4 STRL (SOFTGOODS) ×3
PADDING CAST COTTON 6X4 ST (SOFTGOODS) IMPLANT
PATTIES SURGICAL .5 X.5 (GAUZE/BANDAGES/DRESSINGS) ×5 IMPLANT
PULSAVAC PLUS IRRIG FAN TIP (DISPOSABLE)
SCAFFOLD CELL AUTOLOGOUS MACI (Tissue) IMPLANT
SPONGE KITTNER 5P (MISCELLANEOUS) ×3 IMPLANT
SPONGE T-LAP 18X18 ~~LOC~~+RFID (SPONGE) ×7 IMPLANT
STOCKINETTE BIAS CUT 6 980064 (GAUZE/BANDAGES/DRESSINGS) ×2 IMPLANT
STOCKINETTE IMPERV 14X48 (MISCELLANEOUS) ×2 IMPLANT
SUCTION FRAZIER HANDLE 10FR (MISCELLANEOUS) ×3
SUCTION TUBE FRAZIER 10FR DISP (MISCELLANEOUS) ×1 IMPLANT
SUT BONE WAX W31G (SUTURE) ×1 IMPLANT
SUT MNCRL 4-0 (SUTURE) ×3
SUT MNCRL 4-0 27XMFL (SUTURE) ×1
SUT VIC AB 0 CT1 36 (SUTURE) ×6 IMPLANT
SUT VIC AB 2-0 CT2 27 (SUTURE) ×3 IMPLANT
SUTURE MNCRL 4-0 27XMF (SUTURE) IMPLANT
SYR 10ML LL (SYRINGE) ×3 IMPLANT
TIP FAN IRRIG PULSAVAC PLUS (DISPOSABLE) ×1 IMPLANT
TISSEEL 4ML HEMOSTATIC FIBRIN (Miscellaneous) ×2 IMPLANT
TRAY FOLEY SLVR 16FR LF STAT (SET/KITS/TRAYS/PACK) ×1 IMPLANT
TUBING INFLOW SET DBFLO PUMP (TUBING) ×1 IMPLANT
TUBING OUTFLOW SET DBLFO PUMP (TUBING) ×1 IMPLANT
WAND HAND CNTRL MULTIVAC 90 (MISCELLANEOUS) ×1 IMPLANT
WATER STERILE IRR 500ML POUR (IV SOLUTION) ×3 IMPLANT
WIRE Z .045 C-WIRE SPADE TIP (WIRE) ×2 IMPLANT
WIRE Z .062 C-WIRE SPADE TIP (WIRE) ×1 IMPLANT
WRAPON POLAR PAD KNEE (MISCELLANEOUS) ×3

## 2021-11-25 NOTE — H&P (Signed)
Paper H&P to be scanned into permanent record. H&P reviewed. No significant changes noted.  

## 2021-11-25 NOTE — Anesthesia Procedure Notes (Signed)
Procedure Name: Intubation Date/Time: 11/25/2021 11:24 AM Performed by: Rodney Booze, CRNA Pre-anesthesia Checklist: Patient identified, Emergency Drugs available, Suction available and Patient being monitored Patient Re-evaluated:Patient Re-evaluated prior to induction Oxygen Delivery Method: Circle system utilized Preoxygenation: Pre-oxygenation with 100% oxygen Induction Type: IV induction Ventilation: Mask ventilation without difficulty Laryngoscope Size: Miller and 2 Grade View: Grade I Tube type: Oral Tube size: 7.0 mm Number of attempts: 1 Airway Equipment and Method: Stylet and Oral airway Placement Confirmation: ETT inserted through vocal cords under direct vision, positive ETCO2 and breath sounds checked- equal and bilateral Secured at: 22 cm Tube secured with: Tape Dental Injury: Teeth and Oropharynx as per pre-operative assessment

## 2021-11-25 NOTE — Anesthesia Preprocedure Evaluation (Addendum)
Anesthesia Evaluation  Patient identified by MRN, date of birth, ID band Patient awake    Reviewed: Allergy & Precautions, NPO status , Patient's Chart, lab work & pertinent test results  Airway Mallampati: II  TM Distance: >3 FB Neck ROM: Full    Dental no notable dental hx.    Pulmonary asthma ,    Pulmonary exam normal breath sounds clear to auscultation       Cardiovascular Exercise Tolerance: Good hypertension, Normal cardiovascular exam Rhythm:Regular Rate:Normal     Neuro/Psych  Headaches, PSYCHIATRIC DISORDERS Anxiety Depression    GI/Hepatic negative GI ROS, Neg liver ROS,   Endo/Other  negative endocrine ROS  Renal/GU negative Renal ROS  negative genitourinary   Musculoskeletal  (+) Arthritis , Osteoarthritis,    Abdominal Normal abdominal exam  (+)   Peds negative pediatric ROS (+)  Hematology negative hematology ROS (+)   Anesthesia Other Findings   Reproductive/Obstetrics negative OB ROS                                                            Anesthesia Evaluation  Patient identified by MRN, date of birth, ID band Patient awake    Reviewed: Allergy & Precautions, H&P , NPO status , Patient's Chart, lab work & pertinent test results  Airway Mallampati: II  TM Distance: >3 FB Neck ROM: full    Dental no notable dental hx.    Pulmonary asthma ,    Pulmonary exam normal breath sounds clear to auscultation       Cardiovascular hypertension, Normal cardiovascular exam Rhythm:regular Rate:Normal     Neuro/Psych  Headaches, Anxiety    GI/Hepatic   Endo/Other    Renal/GU      Musculoskeletal   Abdominal   Peds  Hematology   Anesthesia Other Findings   Reproductive/Obstetrics                             Anesthesia Physical Anesthesia Plan  ASA: 2  Anesthesia Plan: General LMA   Post-op Pain Management:     Induction:   PONV Risk Score and Plan: 3 and Treatment may vary due to age or medical condition, Dexamethasone, Ondansetron and Scopolamine patch - Pre-op  Airway Management Planned:   Additional Equipment:   Intra-op Plan:   Post-operative Plan:   Informed Consent: I have reviewed the patients History and Physical, chart, labs and discussed the procedure including the risks, benefits and alternatives for the proposed anesthesia with the patient or authorized representative who has indicated his/her understanding and acceptance.     Dental Advisory Given  Plan Discussed with: CRNA  Anesthesia Plan Comments:         Anesthesia Quick Evaluation  Anesthesia Physical Anesthesia Plan  ASA: 2  Anesthesia Plan: General   Post-op Pain Management:    Induction: Intravenous  PONV Risk Score and Plan: 3 and Ondansetron, Dexamethasone and Midazolam  Airway Management Planned: Oral ETT  Additional Equipment:   Intra-op Plan:   Post-operative Plan: Extubation in OR  Informed Consent: I have reviewed the patients History and Physical, chart, labs and discussed the procedure including the risks, benefits and alternatives for the proposed anesthesia with the patient or authorized representative who has indicated his/her understanding and acceptance.  Dental Advisory Given  Plan Discussed with: Anesthesiologist, CRNA and Surgeon  Anesthesia Plan Comments: (Patient consented for risks of anesthesia including but not limited to:  - adverse reactions to medications - damage to eyes, teeth, lips or other oral mucosa - nerve damage due to positioning  - sore throat or hoarseness - Damage to heart, brain, nerves, lungs, other parts of body or loss of life  Patient voiced understanding.)       Anesthesia Quick Evaluation                                  Anesthesia Evaluation  Patient identified by MRN, date of birth, ID band Patient  awake    Reviewed: Allergy & Precautions, H&P , NPO status , Patient's Chart, lab work & pertinent test results  Airway Mallampati: II  TM Distance: >3 FB Neck ROM: full    Dental no notable dental hx.    Pulmonary asthma ,    Pulmonary exam normal breath sounds clear to auscultation       Cardiovascular hypertension, Normal cardiovascular exam Rhythm:regular Rate:Normal     Neuro/Psych  Headaches, Anxiety    GI/Hepatic   Endo/Other    Renal/GU      Musculoskeletal   Abdominal   Peds  Hematology   Anesthesia Other Findings   Reproductive/Obstetrics                             Anesthesia Physical Anesthesia Plan  ASA: 2  Anesthesia Plan: General LMA   Post-op Pain Management:    Induction:   PONV Risk Score and Plan: 3 and Treatment may vary due to age or medical condition, Dexamethasone, Ondansetron and Scopolamine patch - Pre-op  Airway Management Planned:   Additional Equipment:   Intra-op Plan:   Post-operative Plan:   Informed Consent: I have reviewed the patients History and Physical, chart, labs and discussed the procedure including the risks, benefits and alternatives for the proposed anesthesia with the patient or authorized representative who has indicated his/her understanding and acceptance.     Dental Advisory Given  Plan Discussed with: CRNA  Anesthesia Plan Comments:         Anesthesia Quick Evaluation

## 2021-11-25 NOTE — Discharge Instructions (Addendum)
Post-Op Instructions  1. Bracing or crutches: You will be provided with a long brace (from hip to ankle) and crutches at the surgery center.   2. Ice: You will be provided with a device [facility]) that allows you to ice the affected area effectively.   3. Showering: Incision must remain dry for 5 days. Afterwards, you may shower and gently pat incision dry.  NO submerging wound for 4 weeks. There is an absorbable stitch. Dermabond (purple glue) will fall off on its own.  4. Driving: You will be given specific driving precautions at discharge. Plan on not driving for at least one week for left knee surgery, and 4 weeks for right knee surgery if you are restricted due to the brace and knee motion. Please note that you are advised NOT to drive while taking narcotic pain medications as you may be impaired and unsafe to drive.  5. Activity: Weight bearing: FLAT FOOT WEIGHT BEARING (weight of leg only) with brace locked in extension. Bending the knee is limited and will be guided by the physical therapist. Elevate knee above heart level as much as possible for one week. Avoid standing more than 5 minutes (consecutively) for the first week. No exercise involving the knee until cleared by the surgeon or physical therapist.  Avoid long distance travel for 4 weeks.  6. Medications: - You have been provided a prescription for narcotic pain medicine. After surgery, take 1-2 narcotic tablets every 4 hours if needed for severe pain.  - A prescription for anti-nausea medication will be provided in case the narcotic medicine causes nausea - take 1 tablet every 6 hours only if nauseated.  - Take enteric coated aspirin 325 mg once daily for 4 weeks to prevent blood clots.  -Take tylenol 1000 every 8 hours for pain.  May stop tylenol 3 days after surgery or when you are having minimal pain.  If you are taking prescription medication for anxiety, depression, insomnia, muscle spasm, chronic pain, or for attention  deficit disorder, you are advised that you are at a higher risk of adverse effects with use of narcotics post-op, including narcotic addiction/dependence, depressed breathing, death. If you use non-prescribed substances: alcohol, marijuana, cocaine, heroin, methamphetamines, etc., you are at a higher risk of adverse effects with use of narcotics post-op, including narcotic addiction/dependence, depressed breathing, death. You are advised that taking > 50 morphine milligram equivalents (MME) of narcotic pain medication per day results in twice the risk of overdose or death. For your prescription provided: oxycodone 5 mg - taking more than 6 tablets per day would result in > 50 morphine milligram equivalents (MME) of narcotic pain medication. Be advised that we will prescribe narcotics short-term, for acute post-operative pain, only 3 weeks for major operations such as knee repair/reconstruction surgeries.   7. Bandages: The physical therapist should change the bandages at the first post-op appointment. If needed, the dressing supplies have been provided to you.  8. Physical Therapy: 2 times per week for the first 4 weeks, then 1-2 times per week from weeks 4-8 post-op. Therapy typically starts on post operative Day 3 or 4. You have been provided an order for physical therapy today and should schedule your appointments in advance to avoid delay. The therapist will provide home exercises.  9. Work or School: For most, but not all procedures, we advise staying out of work or school for at least 1 to 2 weeks in order to recover from the stress of surgery and to allow  time for healing and swelling control. If you need a work or school note this can be provided.   10. Post-Op Appointments: Your first post-op appointment will be with Dr. Allena Katz in approximately 2 weeks time. Please double check if this will be at the [facility] facility (Tuesdays and Thursdays) or [place] facility (Wednesdays).    If you find  that they have not been scheduled please call the Orthopaedic Appointment front desk at [phone]. AMBULATORY SURGERY  DISCHARGE INSTRUCTIONS   The drugs that you were given will stay in your system until tomorrow so for the next 24 hours you should not:  Drive an automobile Make any legal decisions Drink any alcoholic beverage   You may resume regular meals tomorrow.  Today it is better to start with liquids and gradually work up to solid foods.  You may eat anything you prefer, but it is better to start with liquids, then soup and crackers, and gradually work up to solid foods.   Please notify your doctor immediately if you have any unusual bleeding, trouble breathing, redness and pain at the surgery site, drainage, fever, or pain not relieved by medication.    Your post-operative visit with Dr.                                       is: Date:                        Time:    Please call to schedule your post-operative visit.  Additional Instructions:

## 2021-11-25 NOTE — Transfer of Care (Signed)
Immediate Anesthesia Transfer of Care Note  Patient: Brooke Walters  Procedure(s) Performed: Right patella MACI (Right: Knee)  Patient Location: PACU  Anesthesia Type:General  Level of Consciousness: drowsy  Airway & Oxygen Therapy: Patient Spontanous Breathing and Patient connected to nasal cannula oxygen  Post-op Assessment: Report given to RN and Post -op Vital signs reviewed and stable  Post vital signs: stable  Last Vitals:  Vitals Value Taken Time  BP 135/87 11/25/21 1354  Temp    Pulse 109 11/25/21 1357  Resp 17 11/25/21 1358  SpO2 100 % 11/25/21 1357  Vitals shown include unvalidated device data.  Last Pain:  Vitals:   11/25/21 1000  TempSrc: Tympanic         Complications: No notable events documented.

## 2021-11-25 NOTE — Anesthesia Procedure Notes (Signed)
Anesthesia Regional Block: Adductor canal block   Pre-Anesthetic Checklist: , timeout performed,  Correct Patient, Correct Site, Correct Laterality,  Correct Procedure, Correct Position, site marked,  Risks and benefits discussed,  Surgical consent,  Pre-op evaluation,  At surgeon's request and post-op pain management  Laterality: Right  Prep: chloraprep       Needles:  Injection technique: Single-shot  Needle Type: Stimiplex     Needle Length: 9cm  Needle Gauge: 22     Additional Needles:   Procedures:,,,, ultrasound used (permanent image in chart),,    Narrative:  Start time: 11/25/2021 11:06 AM End time: 11/25/2021 11:08 AM Injection made incrementally with aspirations every 20 mL.  Performed by: Personally  Anesthesiologist: Foye Deer, MD  Additional Notes: Patient consented for risk and benefits of nerve block including but not limited to nerve damage, failed block, bleeding and infection.  Patient voiced understanding.  Functioning IV was confirmed and monitors were applied.  Timeout done prior to procedure and prior to any sedation being given to the patient.  Patient confirmed procedure site prior to any sedation given to the patient.  A 10mm 22ga Stimuplex needle was used. Sterile prep,hand hygiene and sterile gloves were used.  Minimal sedation used for procedure.  No paresthesia endorsed by patient during the procedure.  Negative aspiration and negative test dose prior to incremental administration of local anesthetic. The patient tolerated the procedure well with no immediate complications.

## 2021-11-25 NOTE — Op Note (Signed)
OPERATIVE NOTE  11/25/2021   PRE-OP DIAGNOSIS:  1. Right patella chondral defect   POST-OP DIAGNOSIS:  1. Right patella chondral defect  PROCEDURES:  1. Right patella matrix autologous chondrocyte implantation  SURGEON:Jennika Ringgold Jearld Lesch, MD  ASSISTANT(S): J.Todd Prescott [place], [place]  ANESTHESIA:Gen  TOTAL IV FLUIDS: see anesthesia record  ESTIMATED BLOOD LOSS: 100cc  TOURNIQUET TIME:  27 min  DRAINS: none  SPECIMENS: None.  IMPLANTS:  MACI mebrane  COMPLICATIONS: None apparent.  INDICATIONS: Brooke Walters is a 40 y.o. female with patellofemoral pain. Subsequent MRI showed a chondral defect of the patella.  She failed extensive nonoperative management and has been unable to maintain her desired activity level. Given the patient's activity level and chondral defect, surgery was recommended for autologous chondrocyte implantation of the patella.  Prior arthroscopy had been performed confirming the size of the lesion and full-thickness nature.  MACI biopsy was performed at the time of prior arthroscopy.  This is a planned staged procedure for MACI implantation.  After discussion of risks, benefits, and alternatives to surgery, the patient elected to proceed.  We also discussed anterior rising tibial tubercle osteotomy to offload the patellofemoral joint, but the patient elected to proceed with isolated MACI procedure.  DETAILS OF PROCEDURE: The patient was identified in the preoperative holding area and the operative leg was marked.  The patient was brought to the operating room.  She was placed supine on the OR table.  General anesthesia was administered.  A bump was placed under the hip and a tourniquet was placed on the thigh. The operative extremity was prescrubbed with Hibiclens and alcohol, prepped with ChloraPrep, and draped in the usual sterile fashion. The patient was given preoperative IV antibiotics within 30 minutes of the start of the case and a surgical time-out  confirming patient identity, procedure, and laterality was performed.   A longitudinal incision was marked from just proximal patella to the tibial tubercle.  The leg was elevated and exsanguinated with an Esmarch bandage and tourniquet inflated to 250 mmHg.  A 10 blade was used to make the midline knee incision. Medial and lateral flaps were developed.  A medial parapatellar arthrotomy was performed from just proximal to the patella through the quadriceps tendon along the patella down to the insertion of the patellar tendon on the tubercle.  The patella was then everted.  The patella was inspected and noted to have a grade 4 lesion on the medial facet of the patella without extension to the median ridge. Surrounding cartilage was significantly damaged. There were no focal lesions of the trochlea that required treatment. The defect measured 1.2cm (medial-lateral) x 1.6cm (proximal-distal). An oval 13 mm x 20 mm cutting guide allowed for appropriate removal of damaged cartilage without removal of significant healthy cartilage.  This was then impacted into place on the patella and an outline was created.  Damaged cartilage within this outline was then removed using a combination of a rongeur and curette until all damaged cartilage had been removed.  Care was taken to ensure that the subchondral bone was not violated.  The tourniquet was lowered, and appropriate hemostasis was achieved.  Bleeding from the defect bed was controlled with thrombin.  The wound was thoroughly irrigated. The membrane was placed cell side up over the template/Tegaderm and not touched with hands or forceps. The membrane was  then cut with the same cutting guide that had been used previously.     Tisseel glue was applied over the subchondral bone. The membrane  with chondrocytes was placed over the bone defect, cell side down toward bone.   This was held for 3 minutes to allow the Tisseel glue to set.  Next, Tisseel glue was applied to the  edges of the lesion/membrane. The membrane was held in this position for 3 minutes to allow the Tisseel glue to set.   There was no unusual bleeding.  The parapatellar arthrotomy was closed with figure-of-eight stitches of 0 Vicryl. The midline knee incision was then closed with interrupted, inverted, 2-0 Vicryl sutures in the subdermal layer and then 4-0 Monocryl in a running, subcuticular fashion.  Dermabond was applied. Leg was wrapped in cotton and bias wrap.  Polar Care and hinged knee brace locked at 0 degrees was applied.  The patient was brought to PACU in stable condition.  Instrument, sponge, and needle counts were correct prior to wound closure and at the conclusion of the case.   Of note, assistance from a PA was essential to performing the surgery.  PA was present for the entire surgery.  PA assisted with patient positioning, retraction, instrumentation, and wound closure. The surgery would have been more difficult and had longer operative time without PA assistance.          DISPOSITION: PACU - hemodynamically stable.  POSTOPERATIVE PLAN: The patient will be discharged home from the PACU.  She will be flatfoot weightbearing with brace locked in extension at all times and follow the appropriate MACI patella rehabilitation guidelines. Physical therapy to begin post-op day 3-4.  Return to clinic 10-14 days as scheduled.

## 2021-12-03 NOTE — Anesthesia Postprocedure Evaluation (Signed)
Anesthesia Post Note  Patient: Brooke Walters  Procedure(s) Performed: Right patella MACI (Right: Knee)  Patient location during evaluation: PACU Anesthesia Type: General Level of consciousness: awake and alert Pain management: pain level controlled Vital Signs Assessment: post-procedure vital signs reviewed and stable Respiratory status: spontaneous breathing, nonlabored ventilation and respiratory function stable Cardiovascular status: blood pressure returned to baseline and stable Postop Assessment: no apparent nausea or vomiting Anesthetic complications: no   No notable events documented.   Last Vitals:  Vitals:   11/25/21 1422 11/25/21 1450  BP: 133/85 127/76  Pulse: (!) 102 (!) 104  Resp: 18 14  Temp: 36.6 C   SpO2: 100% 98%    Last Pain:  Vitals:   11/28/21 0845  TempSrc:   PainSc: 8                  Foye Deer

## 2021-12-07 ENCOUNTER — Ambulatory Visit
Admission: RE | Admit: 2021-12-07 | Discharge: 2021-12-07 | Disposition: A | Discharge status: Home / Self Care | Payer: BC Managed Care – PPO | Source: Ambulatory Visit | Attending: Orthopedic Surgery | Admitting: Orthopedic Surgery

## 2021-12-07 DIAGNOSIS — M25562 Pain in left knee: Secondary | ICD-10-CM | POA: Diagnosis present

## 2022-09-28 ENCOUNTER — Other Ambulatory Visit: Payer: Self-pay | Admitting: Podiatry

## 2022-09-28 DIAGNOSIS — M7752 Other enthesopathy of left foot: Secondary | ICD-10-CM

## 2022-10-14 ENCOUNTER — Ambulatory Visit
Admission: RE | Admit: 2022-10-14 | Discharge: 2022-10-14 | Disposition: A | Discharge status: Home / Self Care | Payer: BC Managed Care – PPO | Source: Ambulatory Visit | Attending: Podiatry | Admitting: Podiatry

## 2022-10-14 DIAGNOSIS — M7752 Other enthesopathy of left foot: Secondary | ICD-10-CM

## 2022-10-19 ENCOUNTER — Other Ambulatory Visit: Payer: Self-pay | Admitting: Podiatry

## 2022-10-19 DIAGNOSIS — M7752 Other enthesopathy of left foot: Secondary | ICD-10-CM

## 2022-10-30 ENCOUNTER — Ambulatory Visit
Admission: RE | Admit: 2022-10-30 | Discharge: 2022-10-30 | Disposition: A | Discharge status: Home / Self Care | Payer: Self-pay | Source: Ambulatory Visit | Attending: Podiatry | Admitting: Podiatry

## 2022-10-30 DIAGNOSIS — M7752 Other enthesopathy of left foot: Secondary | ICD-10-CM

## 2023-04-11 ENCOUNTER — Other Ambulatory Visit: Payer: Self-pay | Admitting: Orthopedic Surgery

## 2023-04-11 DIAGNOSIS — M238X1 Other internal derangements of right knee: Secondary | ICD-10-CM

## 2023-04-21 ENCOUNTER — Ambulatory Visit
Admission: RE | Admit: 2023-04-21 | Discharge: 2023-04-21 | Disposition: A | Discharge status: Home / Self Care | Payer: Federal, State, Local not specified - PPO | Source: Ambulatory Visit | Attending: Orthopedic Surgery | Admitting: Orthopedic Surgery

## 2023-04-21 DIAGNOSIS — M238X1 Other internal derangements of right knee: Secondary | ICD-10-CM

## 2023-10-01 IMAGING — MR MR KNEE*L* W/O CM
6 series · 40 of 40 positions shown · non-contrast
Comparison: None.

CLINICAL DATA: Knee pain, patellar pain.

EXAM:
MRI OF THE LEFT KNEE WITHOUT CONTRAST
TECHNIQUE: Multiplanar, multisequence MR imaging of the knee was performed. No
intravenous contrast was administered.

[Series 8: T2 fat-sat · axial · left · 4.0mm · 0.50mm/px · z∈[-36,+89]mm · 6 of 26 slices shown (1 of 3)]
[im 1/26]
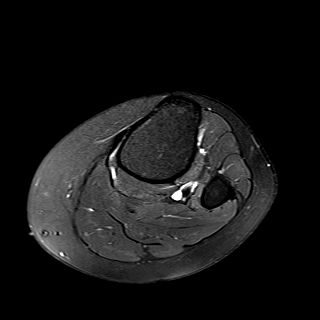
[im 6/26]
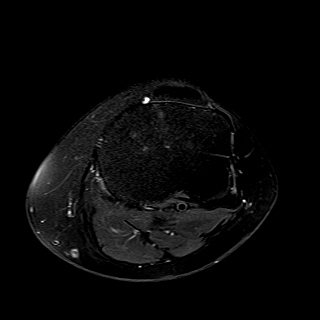
[im 11/26]
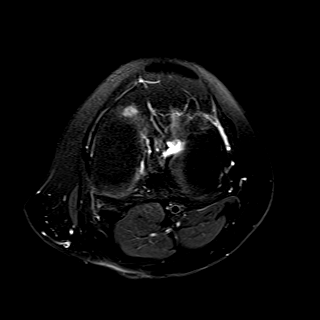
[im 16/26]
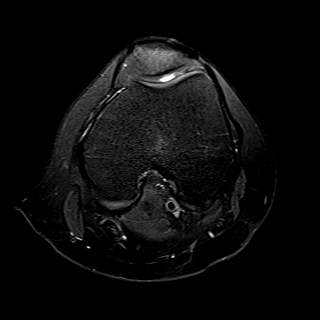
[im 21/26]
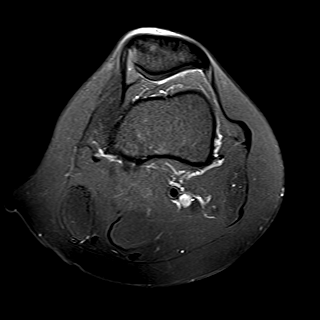
[im 26/26]
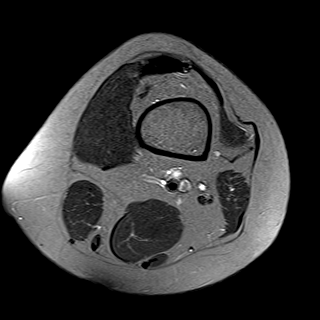

[Series 9: T1 · coronal · left · 4.0mm · 0.47mm/px · 6 of 28 slices shown]
[im 1/28]
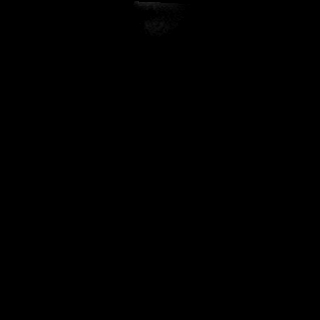
[im 6/28]
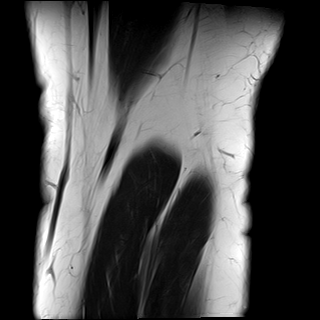
[im 11/28]
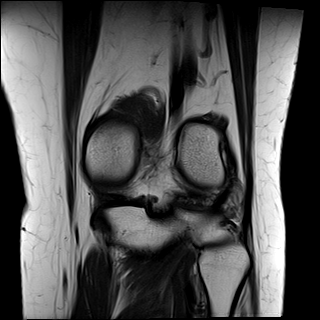
[im 17/28]
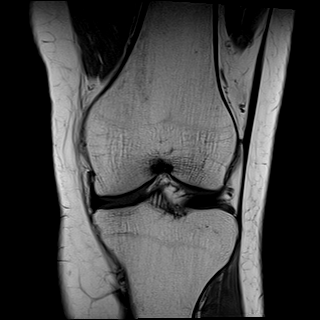
[im 22/28]
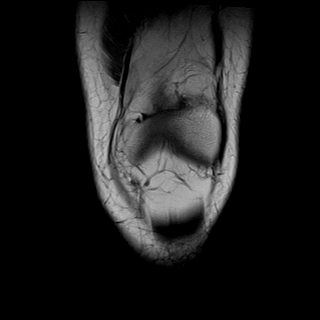
[im 28/28]
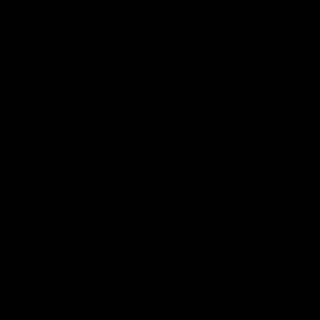

[Series 10: PD fat-sat · sagittal · left · 3.0mm · 0.47mm/px · 8 of 34 slices shown (1 of 2)]
[im 1/34]
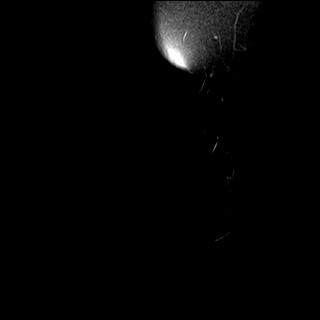
[im 5/34]
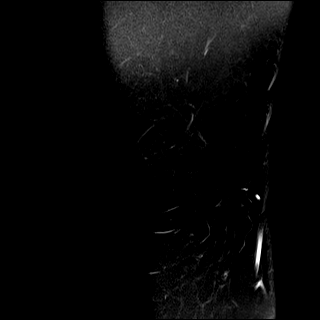
[im 10/34]
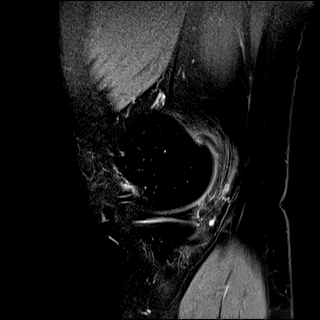
[im 15/34]
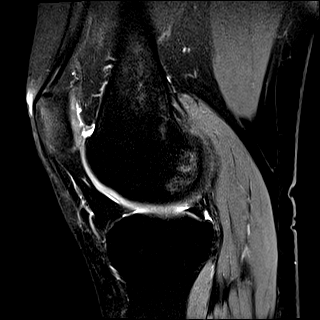
[im 19/34]
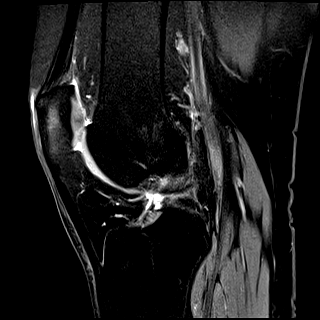
[im 24/34]
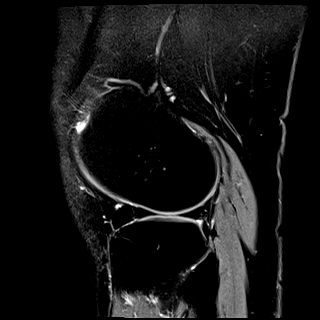
[im 29/34]
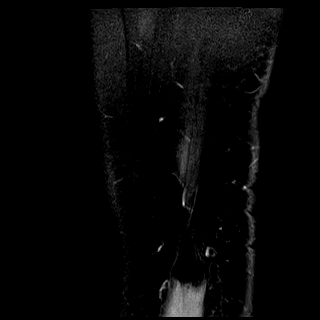
[im 34/34]
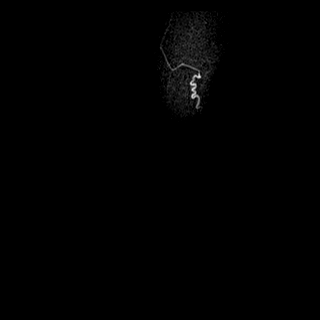

[Series 11: T2 fat-sat · coronal · left · 4.0mm · 0.47mm/px · 6 of 28 slices shown (2 of 3)]
[im 1/28]
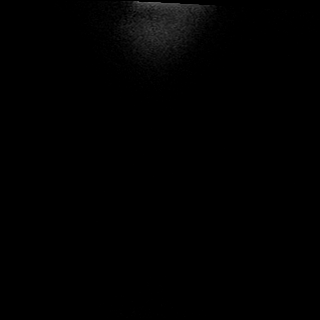
[im 6/28]
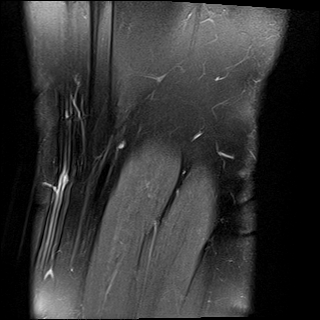
[im 11/28]
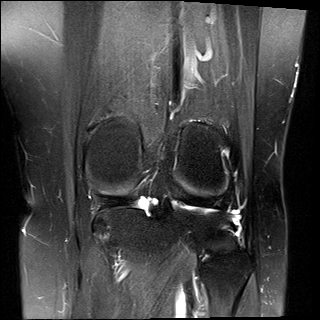
[im 17/28]
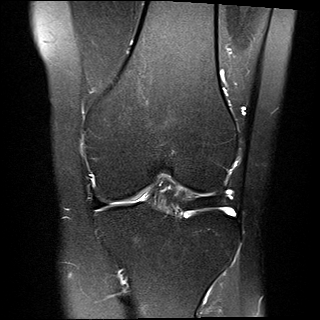
[im 22/28]
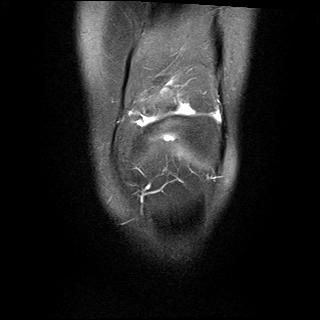
[im 28/28]
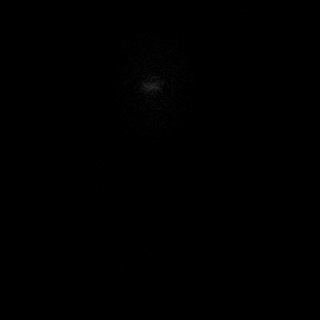

[Series 12: PD fat-sat · coronal · left · 4.0mm · 0.59mm/px · 6 of 28 slices shown (2 of 2)]
[im 1/28]
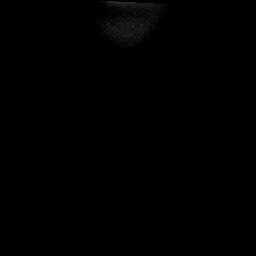
[im 6/28]
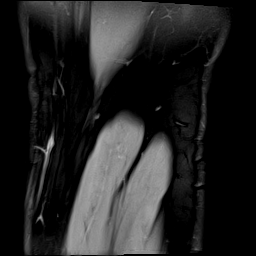
[im 11/28]
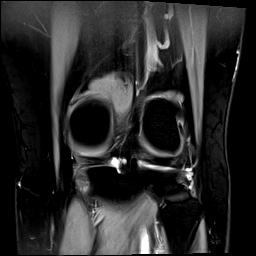
[im 17/28]
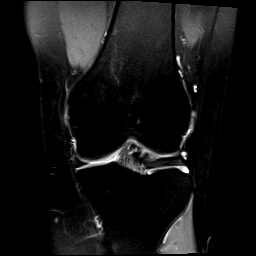
[im 22/28]
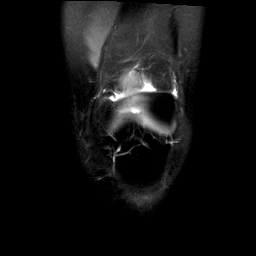
[im 28/28]
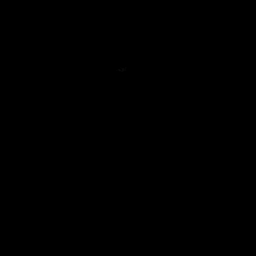

[Series 13: T2 fat-sat · sagittal · left · 3.0mm · 0.47mm/px · 8 of 34 slices shown (3 of 3)]
[im 1/34]
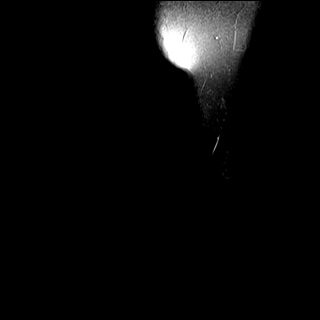
[im 5/34]
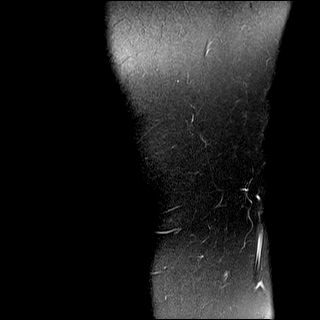
[im 10/34]
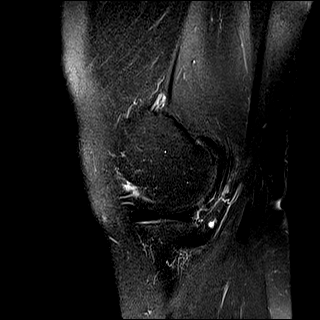
[im 15/34]
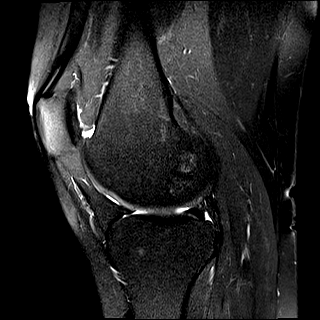
[im 19/34]
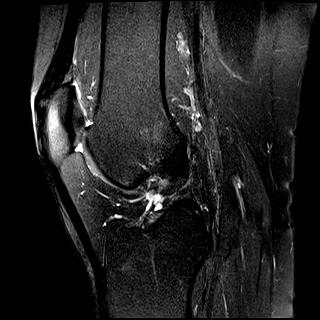
[im 24/34]
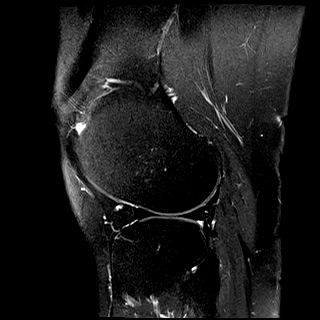
[im 29/34]
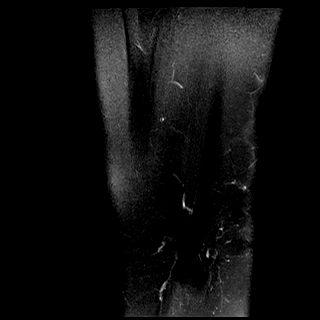
[im 34/34]
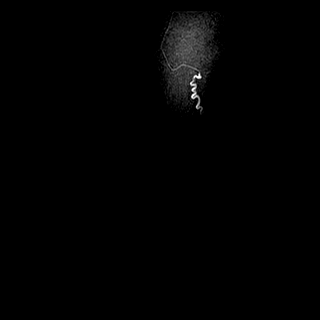

[40 of 40 positions shown; findings below may reference images not displayed]

FINDINGS: MENISCI

Medial meniscus:  Intact.

Lateral meniscus:  Intact.

LIGAMENTS

Cruciates:  Intact ACL and PCL.

Collaterals: Medial collateral ligament is intact. Lateral
collateral ligament complex is intact.

CARTILAGE

Patellofemoral: Focal near full-thickness cartilage fissure at the
medial patellar facet without subchondral edema.

Medial:  Preserved without full-thickness defects.

Lateral:  Preserved with no full-thickness defects.

Joint: No significant joint effusion. Mild edema within the
superolateral Hoffa's fat.

Popliteal Fossa:  No Baker cyst. Intact popliteus tendon.

Extensor Mechanism: Mild thickening and increased signal in the
distal quadriceps tendon. Patellar tendon intact with normal signal.
TT-TG distance within normal limits.

Bones: Fairly diffuse mild bone marrow edema throughout the patella
with relative sparing of the subchondral region.

Other: None.
IMPRESSION: 1. No meniscal or ligamentous injury identified in the left knee.
2. Focal near full-thickness cartilage fissure at the medial
patellar facet without subchondral edema.
3. Fairly diffuse mild bone marrow edema throughout the patella with
relative sparing of the subchondral region. Evidence of mild distal
quadriceps tendinosis. Edema within superolateral Hoffa's fat which
can be seen with impingement syndrome.

## 2023-11-30 ENCOUNTER — Ambulatory Visit: Payer: Self-pay | Admitting: Allergy

## 2023-12-28 ENCOUNTER — Other Ambulatory Visit: Payer: Self-pay

## 2023-12-28 ENCOUNTER — Ambulatory Visit: Payer: Federal, State, Local not specified - PPO | Admitting: Allergy

## 2023-12-28 ENCOUNTER — Encounter: Payer: Self-pay | Admitting: Allergy

## 2023-12-28 VITALS — BP 132/88 | HR 102 | Temp 98.6°F | Resp 18 | Ht 67.5 in | Wt 175.0 lb

## 2023-12-28 DIAGNOSIS — H109 Unspecified conjunctivitis: Secondary | ICD-10-CM

## 2023-12-28 DIAGNOSIS — J4550 Severe persistent asthma, uncomplicated: Secondary | ICD-10-CM

## 2023-12-28 DIAGNOSIS — J310 Chronic rhinitis: Secondary | ICD-10-CM

## 2023-12-28 DIAGNOSIS — H1013 Acute atopic conjunctivitis, bilateral: Secondary | ICD-10-CM | POA: Diagnosis not present

## 2023-12-28 MED ORDER — OLOPATADINE HCL 0.2 % OP SOLN: 1.0000 [drp] | Freq: Every day | OPHTHALMIC | 5 refills | Status: AC | PRN

## 2023-12-28 MED ORDER — LEVOCETIRIZINE DIHYDROCHLORIDE 5 MG PO TABS: 5.0000 mg | ORAL_TABLET | Freq: Every evening | ORAL | 3 refills | Status: AC

## 2023-12-28 MED ORDER — MONTELUKAST SODIUM 10 MG PO TABS: 10.0000 mg | ORAL_TABLET | Freq: Every day | ORAL | 3 refills | Status: DC

## 2023-12-28 MED ORDER — RYALTRIS 665-25 MCG/ACT NA SUSP: 2.0000 | Freq: Two times a day (BID) | NASAL | Status: DC | PRN

## 2023-12-28 MED ORDER — IPRATROPIUM BROMIDE 0.03 % NA SOLN: 1.0000 | Freq: Three times a day (TID) | NASAL | 5 refills | Status: AC | PRN

## 2023-12-28 MED ORDER — RYALTRIS 665-25 MCG/ACT NA SUSP: 2.0000 | Freq: Two times a day (BID) | NASAL | 5 refills | Status: DC | PRN

## 2023-12-28 NOTE — Progress Notes (Signed)
New Patient Note  RE: Brooke Walters MRN: [id number] DOB: [birth year] Date of Office Visit: 12/28/2023  Primary care provider: Virl Son MD  Chief Complaint: allergies  History of present illness: Brooke Walters is a 43 y.o. female presenting today for evaluation of allergies.   Discussed the use of AI scribe software for clinical note transcription with the patient, who gave verbal consent to proceed.    The patient, with a history of asthma and allergies[place] has since experienced a resurgence of allergy symptoms. The patient reported a history of severe allergies during childhood, which led to the development of asthma. The symptoms were managed with allergy medication and the asthma was eventually controlled. However, upon moving back to the area in 2020, the patient experienced a recurrence of severe itching during exercise, particularly in humid conditions. Despite doubling the dosage of allergy medication as advised by her previous physician, the symptoms persisted.  The patient sought help from an allergist in 2021, but testing did not reveal any specific allergens. Despite this, the patient has been able to resume outdoor activities with regular doses of allergy medication, although humid conditions still trigger symptoms. The patient also reported a history of eczema, which improved over time.  The patient's current symptoms include frequent throat clearing, itchy eyes, and increased sneezing. These symptoms are worse in the spring and are present throughout the year, necessitating continuous use of allergy medication. The patient also reported post-nasal drip, which sometimes leads to a sensation of choking as the mucus can be very thick.  The patient has been using Singulair (montelukast) since the age of 68 and has recently switched from Allegra (fexofenadine) to Xyzal (levocetirizine) for symptom control. The patient also  uses Flonase (fluticasone) and has been prescribed Atrovent (ipratropium) nasal sprays last year when she had a URI.  The patient has been on Dupixent (dupilumab) for a few years for asthma control and sinus issues.  She does get Dupixent administered at home by her husband.  The patient uses ProAir (albuterol) as a rescue inhaler for asthma and has only needed to use it twice in the past few weeks. The patient also reported a history of being on allergy shots for several years while living in [place].  She follows with Dr Karna Christmas in pulmonology for her asthma.    Review of systems: 10pt ROS negative unless noted above in HPI  All other systems negative unless noted above in HPI  Past medical history: Past Medical History:  Diagnosis Date   Anxiety    Asthma    Broken heart syndrome    Depression    Dyspnea    Eczema    Headache    Hypertension    Migraine    Osteoarthritis    Urticaria     Past surgical history: Past Surgical History:  Procedure Laterality Date   DIAGNOSTIC LAPAROSCOPY WITH REMOVAL OF ECTOPIC PREGNANCY     DILATION AND EVACUATION N/A 08/24/2020   Procedure: DILATATION AND EVACUATION;  Surgeon: Hermina Staggers, MD;  Location: [facility];  Service: Gynecology;  Laterality: N/A;   INDUCED ABORTION     KNEE ARTHROSCOPY Right 10/07/2021   Procedure: Right knee arthroscopic patellar debridement medial and MACI biopsy;  Surgeon: Signa Kell, MD;  [facility];  Service: Orthopedics;  Laterality: Right;   LAPAROSCOPIC TUBAL LIGATION Left 08/24/2020   Procedure: LAPAROSCOPIC LEFT SALPINGECTOMY;  Surgeon: Hermina Staggers, MD;  Location: [facility];  Service: Gynecology;  Laterality: Left;   SINOSCOPY     SINUS EXPLORATION     x2    Family history:  Family History  Problem Relation Age of Onset   Allergic rhinitis Mother    Urticaria Son    Allergic rhinitis Son    Angioedema Son    Allergic rhinitis Son     Asthma Neg Hx    Immunodeficiency Neg Hx    Eczema Neg Hx     Social history: Lives in a home with out carpeting with electric heating and central cooling.  No pets in the home.  There is concern for water damage or mildew in the home.  No concern for roaches in the home.  She is an Pensions consultant.  She denies a smoking history.  Medication List: Current Outpatient Medications  Medication Sig Dispense Refill   dupilumab (DUPIXENT) 200 MG/1. prefilled syringe Inject 200 mg into the skin every 14 (fourteen) days.     DUPIXENT 300 MG/2ML SOAJ Inject 2 mLs into the skin every 14 (fourteen) days.     EPINEPHrine 0.3 mg/0.3 mL IJ SOAJ injection Inject 0.3 mg into the muscle as needed.     fluticasone (FLONASE) 50 MCG/ACT nasal spray Place 2 sprays into both nostrils daily.     gabapentin (NEURONTIN) 400 MG capsule Take 400 mg by mouth at bedtime.     Galcanezumab-gnlm (EMGALITY) 120 MG/ML SOAJ Inject 120 mg into the skin every 30 (thirty) days.     levalbuterol (XOPENEX HFA) 45 MCG/ACT inhaler Inhale 2 puffs into the lungs every 4 (four) hours as needed for wheezing or shortness of breath.     levocetirizine (XYZAL) 5 MG tablet Take 1 tablet (5 mg total) by mouth every evening. 30 tablet 3   Multiple Vitamins-Minerals (MULTIVITAMIN ADULT) CHEW Chew 2 each by mouth daily.     nortriptyline (PAMELOR) 10 MG capsule Take 40 mg by mouth at bedtime.     Olopatadine HCl 0.2 % SOLN Apply 1 drop to eye daily as needed (itchy/watery eyes). 2.5 mL 5   valACYclovir (VALTREX) 1000 MG tablet Take 1,000 mg by mouth 2 (two) times daily as needed (outbreaks).     ipratropium (ATROVENT) 0.03 % nasal spray Place 1 spray into both nostrils 3 (three) times daily as needed (runny nose/drainage). 30 mL 5   loratadine (CLARITIN) 10 MG tablet Take 10 mg by mouth at bedtime. (Patient not taking: Reported on 12/28/2023)     montelukast (SINGULAIR) 10 MG tablet Take 1 tablet (10 mg total) by mouth at bedtime. 30 tablet 3    Olopatadine-Mometasone (RYALTRIS) 665-25 MCG/ACT SUSP Place 2 sprays into both nostrils 2 (two) times daily as needed. 29 g 5   Olopatadine-Mometasone (RYALTRIS) 665-25 MCG/ACT SUSP Place 2 sprays into both nostrils 2 (two) times daily as needed.     No current facility-administered medications for this visit.    Known medication allergies: Allergies  Allergen Reactions   Ibuprofen Shortness Of Breath   Penicillins Hives   Yellow Dyes (Non-Tartrazine) Hives    Pt received dye durning surgery. Not sure what type of dye   Zyrtec [Cetirizine] Hives     Physical examination: Blood pressure 132/88, pulse (!) 102, temperature 98.6 F (37 C), temperature source Temporal, resp. rate 18, height 5' 7.5" (1.715 m), weight 175 lb (79.4 kg), SpO2 98%, unknown if currently breastfeeding.  General: Alert, interactive, in no acute distress. HEENT: PERRLA, TMs pearly gray, turbinates mildly  edematous without discharge, post-pharynx non erythematous. Neck: Supple without lymphadenopathy. Lungs: Clear to auscultation without wheezing, rhonchi or rales. {no increased work of breathing. CV: Normal S1, S2 without murmurs. Abdomen: Nondistended, nontender. Skin: Warm and dry, without lesions or rashes. Extremities:  No clubbing, cyanosis or edema. Neuro:   Grossly intact.  Diagnositics/Labs: None today  Assessment and plan:   Allergic rhinitis with conjunctivitis Chronic symptoms of throat clearing, itchy eyes, sneezing, and post-nasal drip. Symptoms are worse in the spring but present year-round. -Order blood work for Hotel manager to identify specific allergens. -Continue montelukast 10mg  daily. -Continue Xyzal 5mg  or Allegra 180mg  daily as needed.   -Start Ryaltris nasal spray 2 sprays each nostril twice a day as needed for runny or stuffy nose -Can use nasal Atrovent 2 sprays each nostril up to 4 times a day as needed for mucus and drainage control.  Can also help during times or colds  or respiratory illnesses. -Consider use of antihistamine eye drops like Pataday 1 drop each eye daily as needed for itchy or watery eyes  Asthma Controlled with Dupixent and montelukast. Occasional use of Xopenex rescue inhaler. -Continue Dupixent and montelukast. -Following with Dr Karna Christmas in pulmonology, continue follow-up care  Eczema History of eczema, currently not an issue. -No changes to current management.  Follow-up in 3-4 months or sooner if needed   I appreciate the opportunity to take part in Brooke Walters's care. Please do not hesitate to contact me with questions.  Sincerely,   Margo Aye, MD Allergy/Immunology [facility]

## 2023-12-28 NOTE — Patient Instructions (Signed)
Allergic rhinitis with conjunctivitis Chronic symptoms of throat clearing, itchy eyes, sneezing, and post-nasal drip. Symptoms are worse in the spring but present year-round. -Order blood work for Hotel manager to identify specific allergens. -Continue montelukast 10mg  daily. -Continue Xyzal 5mg  or Allegra 180mg  daily as needed.   -Start Ryaltris nasal spray 2 sprays each nostril twice a day as needed for runny or stuffy nose -Can use nasal Atrovent 2 sprays each nostril up to 4 times a day as needed for mucus and drainage control.  Can also help during times or colds or respiratory illnesses. -Consider use of antihistamine eye drops like Pataday 1 drop each eye daily as needed for itchy or watery eyes  Asthma Controlled with Dupixent and montelukast. Occasional use of Xopenex rescue inhaler. -Continue Dupixent and montelukast. -Following with Dr Karna Christmas in pulmonology, continue follow-up care  Eczema History of eczema, currently not an issue. -No changes to current management.  Follow-up in 3-4 months or sooner if needed

## 2023-12-28 NOTE — Progress Notes (Signed)
Medication Samples have been provided to the patient.  Drug name: Ryaltris       Strength: 679mcg/25mcg        Qty: 9g  LOT: [id number]  Exp.Date: 01-10-25  Dosing instructions: 2 spray each nostril twice daily as needed.   The patient has been instructed regarding the correct time, dose, and frequency of taking this medication, including desired effects and most common side effects.   Genia Harold 2:49 PM 12/28/2023

## 2023-12-31 LAB — ALLERGENS W/TOTAL IGE AREA 2
Alternaria Alternata IgE: <0.1 kU/L
Aspergillus Fumigatus IgE: 0.4 kU/L — AB
Bermuda Grass IgE: <0.1 kU/L
Cat Dander IgE: <0.1 kU/L
Cedar, Mountain IgE: <0.1 kU/L
Cladosporium Herbarum IgE: <0.1 kU/L
Cockroach, German IgE: <0.1 kU/L
Common Silver Birch IgE: <0.1 kU/L
Cottonwood IgE: <0.1 kU/L
D Farinae IgE: <0.1 kU/L
D Pteronyssinus IgE: <0.1 kU/L
Dog Dander IgE: <0.1 kU/L
Elm, American IgE: <0.1 kU/L
IgE (Immunoglobulin E), Serum: 13 [IU]/mL (ref 6–495)
Johnson Grass IgE: <0.1 kU/L
Maple/Box Elder IgE: <0.1 kU/L
Mouse Urine IgE: <0.1 kU/L
Oak, White IgE: <0.1 kU/L
Pecan, Hickory IgE: <0.1 kU/L
Penicillium Chrysogen IgE: <0.1 kU/L
Pigweed, Rough IgE: <0.1 kU/L
Ragweed, Short IgE: <0.1 kU/L
Sheep Sorrel IgE Qn: <0.1 kU/L
Timothy Grass IgE: 0.13 kU/L — AB
White Mulberry IgE: <0.1 kU/L

## 2024-03-21 ENCOUNTER — Ambulatory Visit: Payer: Federal, State, Local not specified - PPO | Admitting: Allergy

## 2024-06-22 ENCOUNTER — Other Ambulatory Visit: Payer: Self-pay | Admitting: Allergy

## 2024-07-11 ENCOUNTER — Ambulatory Visit: Admitting: Allergy

## 2024-07-11 ENCOUNTER — Encounter: Payer: Self-pay | Admitting: Allergy

## 2024-07-11 VITALS — BP 108/78 | HR 96 | Resp 16

## 2024-07-11 DIAGNOSIS — H1013 Acute atopic conjunctivitis, bilateral: Secondary | ICD-10-CM | POA: Diagnosis not present

## 2024-07-11 DIAGNOSIS — H109 Unspecified conjunctivitis: Secondary | ICD-10-CM

## 2024-07-11 DIAGNOSIS — J310 Chronic rhinitis: Secondary | ICD-10-CM | POA: Diagnosis not present

## 2024-07-11 DIAGNOSIS — L2089 Other atopic dermatitis: Secondary | ICD-10-CM | POA: Diagnosis not present

## 2024-07-11 DIAGNOSIS — J4550 Severe persistent asthma, uncomplicated: Secondary | ICD-10-CM | POA: Diagnosis not present

## 2024-07-11 MED ORDER — RYALTRIS 665-25 MCG/ACT NA SUSP: 2.0000 | Freq: Two times a day (BID) | NASAL | 3 refills | Status: AC | PRN

## 2024-07-11 MED ORDER — EUCRISA 2 % EX OINT: TOPICAL_OINTMENT | CUTANEOUS | 3 refills | Status: AC

## 2024-07-11 NOTE — Patient Instructions (Addendum)
 Allergic rhinitis with conjunctivitis Chronic symptoms of throat clearing, itchy eyes, sneezing, and post-nasal drip.  -Environmental allergy testing is positive to grass and mold.   -Continue montelukast  10mg  daily. -May benefit from rotating antihistamine every 3-6 months between Claritin 10mg   Xyzal  5mg  or Allegra 180mg  daily as needed.   -Use Ryaltris  nasal spray 2 sprays each nostril twice a day as needed for runny or stuffy nose -Can use nasal Atrovent  2 sprays each nostril up to 4 times a day as needed for mucus and drainage control if Ryaltris  is not controlling drainage enough.  Can also help during times or colds or respiratory illnesses. -Consider use of antihistamine eye drops like Pataday  1 drop each eye daily as needed for itchy or watery eyes  Asthma Controlled with Dupixent and montelukast .  -Continue Dupixent and montelukast . -have access to Xopenex inhaler 2 puffs every 4-6 hours as needed for cough/wheeze/shortness of breath/chest tightness.  May use 15-20 minutes prior to activity.   Monitor frequency of use.   -Following with Dr Parris in pulmonology, continue follow-up care  Eczema History of eczema, currently not an issue. -No changes to current management. -Eucrisa twice a day application as needed for eczema flare. This is a non-steroid ointment that can be used anywhere in the body  Follow-up in 6 months or sooner if needed

## 2024-07-11 NOTE — Progress Notes (Signed)
 Follow-up Note  RE: Brooke Walters MRN: [id number] DOB: 09-Feb-1981 Date of Office Visit: 07/11/2024   History of present illness: Brooke Walters is a 43 y.o. female presenting today for follow-up of rhinoconjunctivitis, asthma and eczema.  She was last seen in the office on 12/28/23 by myself.  Discussed the use of AI scribe software for clinical note transcription with the patient, who gave verbal consent to proceed.  She has managed well this pollen season without the use of prescribed nasal sprays.  She has both Ryaltris  and Atrovent  options.  She experiences postnasal drip, but it has not been problematic.  However she notes just doing the visit she has had to throat clear multiple times. Her current medication regimen includes montelukast  (Singulair ) and loratadine (Claritin), which she finds effective. She has previously used Allegra and Xyzal , noting all have been effective. She recalls being switched off of Allegra to loratadine years ago when planning to get pregnant.  She has a history of eczema, with the last flare-up in early March, characterized by itching, burning, redness, and scaliness of the chest. She treated it with Eucrisa, which resolved the symptoms. She is unsure if she has enough Eucrisa left and is considering obtaining more.  She has not recently used her rescue inhaler (Xopenex).  She continues to follow with pulmonology and is on Dupixent that she does at home administration.  She is doing well with this.    Review of systems: 10pt ROS negative unless noted above in HPI   Past medical/social/surgical/family history have been reviewed and are unchanged unless specifically indicated below.  No changes  Medication List: Current Outpatient Medications  Medication Sig Dispense Refill   dupilumab (DUPIXENT) 200 MG/1. prefilled syringe Inject 200 mg into the skin every 14 (fourteen) days.     DUPIXENT 300 MG/2ML SOAJ Inject 2 mLs into the  skin every 14 (fourteen) days.     EPINEPHrine  0.3 mg/0.3 mL IJ SOAJ injection Inject 0.3 mg into the muscle as needed.     fluticasone (FLONASE) 50 MCG/ACT nasal spray Place 2 sprays into both nostrils daily.     gabapentin (NEURONTIN) 400 MG capsule Take 400 mg by mouth at bedtime.     Galcanezumab-gnlm (EMGALITY) 120 MG/ML SOAJ Inject 120 mg into the skin every 30 (thirty) days.     ipratropium (ATROVENT ) 0.03 % nasal spray Place 1 spray into both nostrils 3 (three) times daily as needed (runny nose/drainage). 30 mL 5   levalbuterol (XOPENEX HFA) 45 MCG/ACT inhaler Inhale 2 puffs into the lungs every 4 (four) hours as needed for wheezing or shortness of breath.     levocetirizine (XYZAL ) 5 MG tablet Take 1 tablet (5 mg total) by mouth every evening. 30 tablet 3   montelukast  (SINGULAIR ) 10 MG tablet TAKE 1 TABLET BY MOUTH EVERYDAY AT BEDTIME 90 tablet 1   Multiple Vitamins-Minerals (MULTIVITAMIN ADULT) CHEW Chew 2 each by mouth daily.     nortriptyline (PAMELOR) 10 MG capsule Take 40 mg by mouth at bedtime.     Olopatadine  HCl 0.2 % SOLN Apply 1 drop to eye daily as needed (itchy/watery eyes). 2.5 mL 5   Olopatadine -Mometasone (RYALTRIS ) 665-25 MCG/ACT SUSP Place 2 sprays into both nostrils 2 (two) times daily as needed. 29 g 5   valACYclovir (VALTREX) 1000 MG tablet Take 1,000 mg by mouth 2 (two) times daily as needed (outbreaks).     No current facility-administered medications for this visit.     Known medication  allergies: Allergies  Allergen Reactions   Ibuprofen Shortness Of Breath   Penicillins Hives   Yellow Dyes (Non-Tartrazine) Hives    Pt received dye durning surgery. Not sure what type of dye   Zyrtec [Cetirizine] Hives     Physical examination: Blood pressure 108/78, pulse 96, resp. rate 16, SpO2 100%, unknown if currently breastfeeding.  General: Alert, interactive, in no acute distress. HEENT: PERRLA, TMs pearly gray, turbinates minimally edematous with clear  discharge, post-pharynx non erythematous. Neck: Supple without lymphadenopathy. Lungs: Clear to auscultation without wheezing, rhonchi or rales. {no increased work of breathing. CV: Normal S1, S2 without murmurs. Abdomen: Nondistended, nontender. Skin: Warm and dry, without lesions or rashes. Extremities:  No clubbing, cyanosis or edema. Neuro:   Grossly intact.  Diagnostics/Labs: Component     Latest Ref Rng 12/28/2023  IgE (Immunoglobulin E), Serum     6 - 495 IU/mL 13   D Pteronyssinus IgE     Class 0 kU/L <0.10   D Farinae IgE     Class 0 kU/L <0.10   Cat Dander IgE     Class 0 kU/L <0.10   Dog Dander IgE     Class 0 kU/L <0.10   [place] Grass IgE     Class 0 kU/L <0.10   Timothy Grass IgE     Class 0/I kU/L 0.13 !   Johnson Grass IgE     Class 0 kU/L <0.10   Cockroach, German IgE     Class 0 kU/L <0.10   Penicillium Chrysogen IgE     Class 0 kU/L <0.10   Cladosporium Herbarum IgE     Class 0 kU/L <0.10   Aspergillus Fumigatus IgE     Class I kU/L 0.40 !   Alternaria Alternata IgE     Class 0 kU/L <0.10   Maple/Box Elder IgE     Class 0 kU/L <0.10   Common Silver  Birch IgE     Class 0 kU/L <0.10   Cedar, [place] IgE     Class 0 kU/L <0.10   Oak, White IgE     Class 0 kU/L <0.10   Elm, American IgE     Class 0 kU/L <0.10   Cottonwood IgE     Class 0 kU/L <0.10   Pecan, Hickory IgE     Class 0 kU/L <0.10   White Mulberry IgE     Class 0 kU/L <0.10   Ragweed, Short IgE     Class 0 kU/L <0.10   Pigweed, Rough IgE     Class 0 kU/L <0.10   Sheep Sorrel IgE Qn     Class 0 kU/L <0.10   Mouse Urine IgE     Class 0 kU/L <0.10     Assessment and plan: Allergic rhinitis with conjunctivitis Chronic symptoms of throat clearing, itchy eyes, sneezing, and post-nasal drip.  -Environmental allergy testing is positive to grass and mold.   -Continue montelukast  10mg  daily. -May benefit from rotating antihistamine every 3-6 months between Claritin 10mg   Xyzal  5mg   or Allegra 180mg  daily as needed.   -Use Ryaltris  nasal spray 2 sprays each nostril twice a day as needed for runny or stuffy nose -Can use nasal Atrovent  2 sprays each nostril up to 4 times a day as needed for mucus and drainage control if Ryaltris  is not controlling drainage enough.  Can also help during times or colds or respiratory illnesses. -Consider use of antihistamine eye drops like Pataday  1 drop each eye  daily as needed for itchy or watery eyes  Asthma Controlled with Dupixent and montelukast .  -Continue Dupixent and montelukast . -have access to Xopenex inhaler 2 puffs every 4-6 hours as needed for cough/wheeze/shortness of breath/chest tightness.  May use 15-20 minutes prior to activity.   Monitor frequency of use.   -Following with Dr Parris in pulmonology, continue follow-up care  Eczema History of eczema, currently not an issue. -No changes to current management. -Eucrisa twice a day application as needed for eczema flare. This is a non-steroid ointment that can be used anywhere in the body  Follow-up in 6 months or sooner if needed  I appreciate the opportunity to take part in Christna's care. Please do not hesitate to contact me with questions.  Sincerely,   Danita Brain, MD Allergy/Immunology [facility]

## 2024-07-14 ENCOUNTER — Telehealth: Payer: Self-pay

## 2024-07-14 NOTE — Telephone Encounter (Signed)
*  AA  Pharmacy Patient Advocate Encounter   Received notification from CoverMyMeds that prior authorization for Eucrisa  2% ointment  is required/requested.   Insurance verification completed.   The patient is insured through [facility] .   Per test claim: PA required; PA submitted to above mentioned insurance via CoverMyMeds Key/confirmation #/EOC [id number] Status is pending

## 2024-07-15 NOTE — Telephone Encounter (Signed)
 Pharmacy Patient Advocate Encounter  Received notification from [facility] that Prior Authorization for  Eucrisa  2% ointment  has been DENIED.  Full denial letter will be uploaded to the media tab. See denial reason below.   Patient must try and failure of one topical calcineurin inhibitor.  -Pimecrolimus  -Tacrolimus  0.1%/0.03%

## 2024-07-17 MED ORDER — TACROLIMUS 0.1 % EX OINT: TOPICAL_OINTMENT | CUTANEOUS | 0 refills | Status: AC

## 2024-07-17 NOTE — Addendum Note (Signed)
 Addended by: MARCINE ISAIAH CROME on: 07/17/2024 10:00 AM   Modules accepted: Orders

## 2024-07-17 NOTE — Telephone Encounter (Signed)
 Spoke with the patient and informed her that protopic  would need to be used in order for Eucrisa  to be approved by insurance. [facility] [address]. Verbalized understanding.

## 2024-08-22 ENCOUNTER — Other Ambulatory Visit (INDEPENDENT_AMBULATORY_CARE_PROVIDER_SITE_OTHER): Admitting: Allergy

## 2024-08-22 ENCOUNTER — Ambulatory Visit

## 2024-08-22 DIAGNOSIS — Z23 Encounter for immunization: Secondary | ICD-10-CM

## 2024-08-22 MED ORDER — COVID-19 MRNA VAC-TRIS(PFIZER) 30 MCG/0.3ML IM SUSY: 0.3000 mL | PREFILLED_SYRINGE | Freq: Once | INTRAMUSCULAR | 0 refills | Status: AC

## 2024-08-22 NOTE — Progress Notes (Signed)
 Pt request Covid vaccine and I have sent prescription to her pharmacy.  Pt also requested flu vaccine (Fluzone ) today and provided in office in right arm deltoid.

## 2024-09-08 ENCOUNTER — Encounter: Payer: Self-pay | Admitting: Allergy

## 2024-09-10 ENCOUNTER — Ambulatory Visit: Admitting: Dermatology
